# Patient Record
Sex: Male | Born: 1960 | Race: Black or African American | Hispanic: No | Marital: Single | Smoking: Current some day smoker
Health system: Southern US, Community
[De-identification: ages and names within clinical notes are randomized; demographics above are authoritative.]

## PROBLEM LIST (undated history)

## (undated) HISTORY — PX: JOINT REPLACEMENT: SHX530

## (undated) HISTORY — PX: SHOULDER SURGERY: SHX246

---

## 1998-02-21 ENCOUNTER — Emergency Department (HOSPITAL_COMMUNITY): Admission: EM | Admit: 1998-02-21 | Discharge: 1998-02-21 | Payer: Self-pay | Admitting: Emergency Medicine

## 1998-02-21 ENCOUNTER — Encounter: Payer: Self-pay | Admitting: Emergency Medicine

## 2001-03-27 ENCOUNTER — Encounter: Admission: RE | Admit: 2001-03-27 | Discharge: 2001-03-27 | Payer: Self-pay | Admitting: Orthopedic Surgery

## 2001-03-27 ENCOUNTER — Encounter: Payer: Self-pay | Admitting: Orthopedic Surgery

## 2002-05-19 ENCOUNTER — Encounter: Admission: RE | Admit: 2002-05-19 | Discharge: 2002-05-19 | Payer: Self-pay | Admitting: Family Medicine

## 2002-05-19 ENCOUNTER — Encounter: Payer: Self-pay | Admitting: Family Medicine

## 2005-09-17 ENCOUNTER — Encounter: Admission: RE | Admit: 2005-09-17 | Discharge: 2005-09-17 | Payer: Self-pay | Admitting: Family Medicine

## 2006-08-15 ENCOUNTER — Ambulatory Visit (HOSPITAL_COMMUNITY): Admission: RE | Admit: 2006-08-15 | Discharge: 2006-08-15 | Payer: Self-pay | Admitting: Orthopedic Surgery

## 2006-09-03 ENCOUNTER — Ambulatory Visit (HOSPITAL_BASED_OUTPATIENT_CLINIC_OR_DEPARTMENT_OTHER): Admission: RE | Admit: 2006-09-03 | Discharge: 2006-09-04 | Payer: Self-pay | Admitting: Orthopedic Surgery

## 2008-01-09 ENCOUNTER — Ambulatory Visit (HOSPITAL_BASED_OUTPATIENT_CLINIC_OR_DEPARTMENT_OTHER): Admission: RE | Admit: 2008-01-09 | Discharge: 2008-01-09 | Payer: Self-pay | Admitting: Orthopedic Surgery

## 2008-03-23 ENCOUNTER — Inpatient Hospital Stay (HOSPITAL_COMMUNITY): Admission: RE | Admit: 2008-03-23 | Discharge: 2008-03-25 | Payer: Self-pay | Admitting: Orthopedic Surgery

## 2009-02-10 ENCOUNTER — Emergency Department (HOSPITAL_COMMUNITY): Admission: EM | Admit: 2009-02-10 | Discharge: 2009-02-10 | Payer: Self-pay | Admitting: Emergency Medicine

## 2009-02-15 ENCOUNTER — Ambulatory Visit (HOSPITAL_BASED_OUTPATIENT_CLINIC_OR_DEPARTMENT_OTHER): Admission: RE | Admit: 2009-02-15 | Discharge: 2009-02-16 | Payer: Self-pay | Admitting: Orthopedic Surgery

## 2010-08-29 NOTE — Op Note (Signed)
NAME:  Richard Cabrera, Richard Cabrera              ACCOUNT NO.:  [id number]   MEDICAL RECORD NO.:  [id number]          PATIENT TYPE:  AMB   LOCATION:  DSC                          FACILITY:  [facility]   PHYSICIAN:  Richard Fitch. Cabrera, M.D. DATE OF BIRTH:  12-02-1960   DATE OF PROCEDURE:  DATE OF DISCHARGE:                               OPERATIVE REPORT   DATE OF OPERATION:  Sep 03, 2006.   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  Also rule out  significant degenerative arthritis of glenohumeral joint due to prior  anterior instability, treated with a staple anterior capsulorrhaphy more  than 20 years prior.   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   PROCEDURE:  1. Examination of left shoulder under anesthesia, demonstrating mild      posterior instability.  2. Diagnostic arthroscopy of left glenohumeral joint demonstrating a      ruptured long head of the biceps with the proximal stump impinging      within the joint followed by debridement of biceps stump.  3. Debridement of degenerative glenoid labrum and adhesive capsulitis      granulation tissue.  4. Subacromial decompression with acromioplasty and bursectomy with      preservation of coracoacromial ligament.  5. Arthroscopic distal clavicle resection.   OPERATING SURGEON:  Richard Fitch. Cabrera, M.D.   ASSISTANT:  Jonni Sanger, P.A.   ANESTHESIA:  General by endotracheal technique supplemented a left  interscalene block supervised by anesthesiologist, Dr. Gypsy Balsam.   INDICATIONS FOR PROCEDURE:  Richard Cabrera is a 50 year old gentleman  employed by the city of [place].  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  He has a history of prior left shoulder dislocation and during the past  several years, he developed progressive left shoulder pain and  impingement signs.  Recently he had lost motion and had severe night  pain.   Due to the presence of a steel staple from a prior anterior  capsulorrhaphy he was not a candidate for an MRI.  He was sent for a CT  arthrogram which demonstrated what appeared to be a small fixed rotator  cuff tear of the anterior supraspinatus and a ruptured long head of the  biceps. The radiologist could not distinguish a significant amount of  arthritis on the CT arthrogram, however, his plain x-rays revealed  squaring of the humeral head consistent with degenerative changes of the  humeral head.  Due to his chronic pain, he was brought to the operating  room at this time anticipating diagnostic arthroscopy and appropriate  intervention as our findings at surgery dictate.   Preoperatively questions were invited and answered.   PROCEDURE:  Richard Cabrera was brought to the operating room and placed  in supine position on the operating table.  Following the induction of general anesthesia by endotracheal technique,  he was carefully positioned in the beach-chair position with the aid of  torso and head holder for operative shoulder arthroscopy.  The entire  left upper extremity and fore quarter were prepped with DuraPrep and  draped with appropriate arthroscopy drapes.   The shoulder was examined under anesthesia, notable for a moderate  degree of posterior instability with translation of the humeral head  approximately one third the diameter of the head off the glenoid.  He  had moderate interior glenohumeral instability with one third  translation of the head.  We could elevate to a combined position of 160  degrees, externally rotate at 90 degrees abduction to 75 degrees  and  internally rotated approximately 60 degrees.  There was a moderate  degree of adhesive capsulitis although this could be chronic due to his  prior capsulorrhaphy.   The shoulder was then distended with 20 cc of sterile saline and brought  into an anterior spinal needle approach followed by placement of the  scope through a standard posterior portal.  Diagnostic arthroscopy  confirmed grade 4 chondromalacia of more than 50% of the humeral head,  most notable centrally and posteriorly and superiorly.  The deep surface  of the rotator cuff appeared to be intact.  The long head of the biceps  is ruptured with an 18 mm stump hanging within the joint.  The labrum  was extremely degenerative. There were extensive adhesive capsulitis  granulation tissues present.   The glenoid had excellent hyaline cartilage.  The subscapularis appeared  to be intact.  There were no loose bodies noted.   An anterior portal was created under direct vision followed by 4.5  suction shaver to thoroughly debride the glenohumeral joint of  granulation tissue, labral fragments in the biceps tendon.  The biceps  was partially amputated with a basket forceps followed by use of a  grabber to withdrawn through the anterior portal.  After hemostasis was  achieved in the glenohumeral joint, the digital camera was used to  photographically document the grade 4 chondromalacia followed by removal  of the arthroscope.  The scope was then placed in the subacromial space.  The subacromial space was obliterated by an adhesive bursitis.  The  scope ws placed through the lateral portal and with meticulous  technique, the supraspinatus and infraspinatus tendons were tenolysed  off the acromion by release of the adherent and fibrotic bursa.  There  was a type 2 acromion with a large medial osteophyte and a degenerative AC joint. The capsule of the [facility] joint was taken down with a cutting  cautery followed by removal of distal 18  mm of clavicle with a suction  burr and leveling the acromion to a type 1 morphology. The  coracoacromial ligament was preserved.  After extensive bursectomy, the  bursa on the deep surface of the acromion, undersurface of the deltoid  and the rotator cuff tendons was debrided, relieving an adhesive  bursitis predicament.   No effort was made to repair the rotator cuff.  Given the predicament of  severe degeneration of the humeral head and an intact glenoid, Richard Cabrera is an excellent candidate to consider hemiarthroplasty when his  pain becomes problematic.  No effort was undertaken at this time to  remove the staple.  When Mr. Richard Cabrera decides he would like to proceed  with a hemiarthroplasty, we will perform an anterior deltopectoral  exposure of the shoulder and remove the  staple.  The subscapularis will  be taken down and the hemiarthroplasty accomplished through a standard  approach.  At that time, we can address his capsular instability as well  as remove the staple.   FINAL DIAGNOSIS:  Severe glenohumeral degenerative arthritis complicated  by impingement, adhesive capsulitis, labral tear and biceps rupture.   PLAN:  We will have a lengthy postoperative consultation with Mr. Richard Cabrera  regarding future choices for management of his shoulder pain.   COMPLICATIONS:  There were no apparent complications.      Richard Cabrera, M.D.  Electronically Signed     RVS/MEDQ  D:  09/03/2006  T:  09/03/2006  Job:  [id number]   cc:   Luanna Cole. Lenord Fellers, M.D.  Bryan Lemma. Manus Gunning, M.D.

## 2010-08-29 NOTE — Op Note (Signed)
NAME:  Richard Cabrera, Harveer              ACCOUNT NO.:  [id number]   MEDICAL RECORD NO.:  [id number]          PATIENT TYPE:  AMB   LOCATION:  DSC                          FACILITY:  [facility]   PHYSICIAN:  Katy Fitch. Sypher, M.D. 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   POSTOPERATIVE DIAGNOSIS:  Chronic left ulnar neuropathy superimposed on  chronic left upper extremity pain syndrome.   OPERATION:  Anterior subcutaneous transposition of left ulnar nerve at  cubital tunnel with reconstruction of a fascial sleeve to maintain an  anterior position of the ulnar nerve, status post decompression with  both plain film and direct inspection evidence of medial joint line  osteophytes impinging at cubital tunnel, also chronic instability of  ulnar nerve with elbow flexion beyond 90 degrees with anterior roll over  medial epicondyle.   OPERATING SURGEON:  Katy Fitch. Sypher, MD   ASSISTANT:  Marveen Reeks Dasnoit, PA-C   ANESTHESIA:  General by LMA.   SUPERVISING ANESTHESIOLOGIST:  Bedelia Person, MD   INDICATIONS:  Richard Cabrera is a 50 year old former [employer] employee who I have had a working  relationship for more than 6 years.   We have managed bilateral shoulder impingement and significant  degenerative arthritis of the left shoulder.   [name] has worked very hard throughout his career and also had a prior  career as a Community education officer.  He had extensive degenerative disk disease of  the cervical spine, is status post an instability procedure performed  remotely in the left shoulder and has chronic degenerative arthritis of  the left shoulder as well as chronic impingement.  He has had bilateral  shoulder procedures.  He has been complaining of left arm numbness for  months and has been evaluated by Dr. Venetia Maxon of Neurosurgery and Dr.  Clarisse Gouge, neurologist.   Clinically, [name] was noted have an unstable left ulnar nerve.  He was  sent for objective third party electrodiagnostic studies by Dr. Clarisse Gouge.  Dr. Clarisse Gouge noted a conduction velocity of the left ulnar nerve across the  elbow, 43 meters per second with a baseline of greater than 55 meters  per second in the median nerve and other adjacent nerves.  Sincere's had  clinical symptoms of the elbow and numbness in the ulnar aspect of the  hand.   Due to a chronic pain predicament thought to be exacerbated by his  unstable ulnar nerve and given his body habitus as a very thin, very  muscular man and given the fact that his plain films of the elbow  demonstrate medial joint line osteophytes deep to the cubital tunnel.  We recommended proceeding with an anterior subcutaneous transposition.   Preoperatively, he was advised the potential risks and benefits of the  surgery.   He is quite thin and does not have protection for the nerve in a  transposed position per se; however, given his active  lifestyle, working  on a farm, riding horses, and continuing with heavy activities of daily  living, I was reluctant to proceed with anterior submuscular  transposition due to the prolonged rehab the skin and tail.   Mr. Richard Cabrera has been thoroughly evaluated by Dr. Clarisse Gouge and was noted to  have a normal EMG, but significant conduction impairment.  He is brought  to the operating room at this time anticipating anterior subcutaneous  transposition of the left ulnar nerve.   PROCEDURE:  Richard Cabrera is brought to the operating room and  placed in supine position upon the operating table.   Following an anesthesia consult with Dr. Gypsy Balsam, general anesthesia by  LMA technique was recommended and accepted by Mr. Richard Cabrera.   He was brought to room one of the [facility] placed in supine  position upon the  operating table and under Dr. Burnett Corrente direct  supervision, general anesthesia by LMA technique induced.   The left arm was prepped with Betadine soap and solution and sterilely  draped.  One gram of Ancef was administered as IV prophylactic  antibiotic.   Procedure commenced with a 4-cm curvilinear incision paralleling the  path of the ulnar nerve.  The subcutaneous tissues were carefully  divided taking care to identify and protect the posterior branch of the  medial antebrachial cutaneous nerve.   The ulnar nerve was inspected through a range of motion and noted to  have laxity of the arcuate ligament and anterior subluxation of the  epicondyle with flexion beyond 90 degrees.   The nerve was decompressed for a distance of 6 cm above the epicondyle  and approximately 7 cm distally.  A small portion of the flexor carpi  ulnaris muscle belly was removed to facilitate a smooth transition of  the nerve in elbow extension.  The nerve was mobilized with epineural  vessels and great care was taken to protect the articular branch as well  as the branches to the posterior aspect of the flexor carpi ulnaris.  It  was transposed subcutaneously deep to the fascia.  Mr. Richard Cabrera has  minimal subcutaneous fat.   Care was taken to isolate and resect the medial brachial septum during  transposition.  Care was taken to preserve all of the vascular feeding  vessels to the epineurial vessels.   The nerve was transposed and a fascial sleeve created by repairing the  fascia to the medial epicondyle well proximal to maintain an anterior  position of the nerve with elbow extension.   After the nerve was transposed, the profile of the nerve was visible  given his very thin body habitus.  This should, however, be protected  particularly from the medial osteophytes of the cubital tunnel and with  behavior modification should be protected during arm use.   The wound was inspected for bleeding points, which  were  electrocauterized with bipolar forceps under saline followed by release  of the tourniquet.  Hemostasis was achieved by compression and use of  the bipolar cautery.   The wound was repaired with subcutaneous sutures of 4-0 Vicryl and  intradermal 3-0 Prolene with Steri-Strips.  Mr. Richard Cabrera was placed in a  compressive dressing with sterile gauze and Tegaderm followed by an Ace  wrap.   He was advised to begin immediate range of motion excised to the elbow.  He will avoid pressure over the nerve.  We will see him back for  followup in approximate 1 week.   For aftercare, he  is provided a prescription for Percocet 7.5 mg one  p.o. q.4-6 h. p.r.n. pain, 40 tablets without refill.  He is also  provided a Keflex 500 mg one p.o. q.8 h. x4 days as a prophylactic  antibiotic.      Katy Fitch Sypher, M.D.  Electronically Signed     RVS/MEDQ  D:  01/09/2008  T:  01/09/2008  Job:  [id number]

## 2010-08-29 NOTE — Op Note (Signed)
NAME:  Richard Cabrera, Richard Cabrera              ACCOUNT NO.:  [id number]   MEDICAL RECORD NO.:  [id number]          PATIENT TYPE:  INP   LOCATION:  5030                         FACILITY:  [facility]   PHYSICIAN:  Katy Fitch. Sypher, M.D. DATE OF BIRTH:  05-Apr-1961   DATE OF PROCEDURE:  03/23/2008  DATE OF DISCHARGE:                               OPERATIVE REPORT   PREOPERATIVE DIAGNOSES:  1. 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  2. Chronic Bankart lesion, anterior glenohumeral joint with avulsion      of middle glenohumeral ligament, left shoulder.  3. Chronic long head of biceps rupture.   POSTOPERATIVE DIAGNOSES:  1. 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  2. Chronic Bankart lesion, anterior glenohumeral joint with avulsion      of middle glenohumeral ligament, left shoulder.  3. Chronic long head of biceps rupture.  4. Identification of grade 4 chondromalacia of left shoulder humeral      head and minimal anterior glenoid grade 1 and 2 chondromalacia at      site of prior Bankart fracture, also buried steel staple from prior      subscapularis muscle transfer to treat anterior instability in      remote past.   OPERATION:  1. Left shoulder hemiarthroplasty utilizing a 14 x 115-mm Bio-Modular      stem and a 54 x 22-mm Bio-Modular head.  2. Anterior capsular reconstruction with superior advancement and      reconstruction of middle glenohumeral ligament for chronic Bankart      lesion, left shoulder.  3. Long head of biceps tenodesis.  4. Subscapularis reconstruction to lesser tuberosity.  5. Removal of buried steel staple from humeral head.   OPERATING SURGEON:  Katy Fitch. Sypher, MD   ASSISTANT:  Marveen Reeks Dasnoit, PA-C   ANESTHESIA:  General endotracheal  supplemented by a left interscalene  block.   SUPERVISING ANESTHESIOLOGIST:  Guadalupe Maple, MD   ESTIMATED BLOOD LOSS:  300 mL.   DRAINS:  A 7-mm Jackson-Pratt to a suction bulb.   COMPLICATIONS:  None apparent.   INDICATIONS:  Richard Cabrera is a 50 year old right-hand dominant,  employee of the city of [place].  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  He has worked very vigorously during  the past 20 years, shoveling, digging, handling equipment, and Engineer, agricultural.  He has had progressive shoulder pain and is status post  right shoulder debridement and distal clavicle resection and left  shoulder arthroscopy for chronic impingement and instability signs.  At  the time of his left shoulder arthroscopy, he was noted to have severe  humeral head degenerative change due to chronic anterior instability due  to a prior Bankart lesion and Hill-Sachs lesion from a significant  dislocation that was treated elsewhere prior to his employment with a  subscapularis transfer.   Mr. Richard Cabrera was able to work for many years without discomfort but then  had a series of repetitive injuries.  He has been treated for the  injuries but reached a point where he could no longer sleep comfortably  at night, was using narcotic analgesics for pain, and had reached a  point where he was not able to work due to his severe left shoulder  impairment.  Preoperatively, we reviewed with Mr. Richard Cabrera, the  anticipated treatment plan which included a left shoulder  hemiarthroplasty or total shoulder depending on the condition of the  glenoid.  Given the chronic anterior instability, we were anticipating  reconstruction of the anterior glenohumeral ligaments on an as needed  basis based on our findings.  We knew his subscapularis had been  transferred.  We were not sure as to the condition of his capsule  inferiorly.  He had a chronic long head of the biceps rupture due to   impingement that was addressed with his prior arthroscopic procedure.   After informed consent, Mr. Richard Cabrera is brought to the operating at this  time.  Preoperatively, he is advised of potential risks and benefits of  surgery which include possible neurovascular injury, failure to relieve  all of his pain, permanent stiffness, failure to tolerate the implant  components, need for revision at a later date, and possible infection.   Questions were invited and answered in detail.   He is brought to the operating at this time.  Preoperatively, Dr. Noreene Larsson  provided a detailed anesthesia consult.  He recommended proceeding with  a left interscalene block.  This was accepted by Mr. Richard Cabrera.  The block  was placed in the holding area approximately 1 hour prior to the  anticipated surgery.   Mr. Richard Cabrera was given 1 g of Ancef in the holding area and was provided 1  g of vancomycin intraoperatively at the time of component placement.   PROCEDURE:  Richard Cabrera was brought to the operating room and  placed in supine position on the operating table.   Dr. Morley Kos preoperative interscalene block led to excellent anesthesia  of the left shoulder region.   He was transferred to a operative table with an Nurse, children's.  Following induction of general endotracheal anesthesia under Dr.  Morley Kos direct supervision, he was carefully positioned in the beach-  chair position with the aid of a torso and head holder designed for  shoulder arthroscopy.  The entire left upper extremity was prepped with  DuraPrep and draped with impervious arthroscopy drapes.  A Foley  catheter was placed, and calf compression devices were provided for deep  vein thrombosis prophylaxis.   The procedure commenced with resection of the prior surgical scar and  extension to a traditional deltopectoral incision.  Subcutaneous tissues  were carefully divided revealing abundant scar at the deltopectoral  interval  from the prior stabilization surgery.   The cephalic vein was meticulously dissected and feeder vessels from the  deltoid muscle suture ligated with 3-0 silk.  The cephalic vein was  mobilized medially and a large vein complex from the deltoid was suture  ligated.  The bursa space deep to the deltoid was recreated and the  staple identified.  There had been an anterior lateral transfer of the  subscapularis.  There was abundant scarring of the axillary recess.  Once again, extensive dissection was required to identify the inferior  border of the subscapularis, position of the axillary nerve, the  short  head of the biceps, and the coracobrachialis muscles.   The short head of the biceps was relaxed with a cutting cautery to  facilitate exposure of the glenoid.  With great care, the subscapularis  was taken off of the lesser tuberosity and bicipital groove area and  elevated with a stay suture of #2 FiberWire.  After joint entry, the  rotator interval was split, the humerus externally rotated, and the head  delivered.   There was a marginal osteophyte circumferentially around the humeral  head.  The rotator cuff had a very satisfactory insertion from the  bicipital groove anteriorly deep to the supraspinatus, infraspinatus,  and teres minor.  A Fukuda retractor was placed as well as a footed  glenoid retractor anteriorly.  The anterior capsule ligaments were  inspected.  There was an old Bankart fracture at 4 o'clock anteriorly  with a notch into the glenohumeral joint.  The anterior middle  glenohumeral ligament was avulsed and the anterior inferior glenohumeral  ligament was still intact.  The anterior superior glenohumeral ligament  was difficult to define.   The subscapularis was tenolysed from the deep surface of the coracoid  taking care to avoid the brachial plexus.  The subscapularis was  gathered in stay suture and protected.  The neck of the glenoid was  denuded of scar with  a small osteotome, followed by an arthroscopic  rasp.  The insertion of the middle glenohumeral ligament was prepared  for superior advancement and reconstruction.  A grasping suture of #2  FiberWire was placed in such a manner as to evert the ligament to create  a new labrum.  This was brought approximately 1.5 cm proximally and  created an excellent sling for the humeral head.   The shaft of the humerus was delivered and with great care, protecting  the rotator cuff elements, the shaft and neck of the humerus were  prepared with a series of rasps to a 15 mm diameter of the isthmus and  broaching of the proximal humerus to 14 mm.  A trial head measuring 54 x  22 was placed after measuring the native humeral head.  This led to a  very satisfactory reconstruction of the humeral head with an anatomic  height verses the greater tuberosity.  The permanent stem was then  placed with no-touch technique with sutures being placed anteriorly for  repair of the subscapularis with through bone drill holes.   The trial was replaced on the permanent stem and found to be acceptable.   The anterior middle glenohumeral ligament reattachment was then  completed with a PushLock anchor recreating a very satisfactory inset of  the labrum and middle glenohumeral ligament.  With great care, the  shoulder was then delivered with the proximal portion of the humerus  anteriorly and with external rotation of the arm and use of a bone hook,  the neck of the humerus was delivered so as to allow placement of the  humeral head component.   The humeral head was placed with no-touch technique and tamped into  place.   The wound was thoroughly lavaged with sterile saline followed by  reduction of the joint.   The rotator interval was then repaired meticulously with mattress suture  of #2 FiberWire followed by advancement and repair of the subscapularis  anatomically.   The tail of the subscapularis was used to  recreate an anchor point for  the long head of the biceps.  Long head was identified deep  to the  pectoralis major.  This was tenolysed and delivered proximally.  This  was elevated approximately 3 cm and tenodesed to the scar at the site of  the previous staple placement.  The staple was removed with a skid and  needle driver.   The subscapularis was repaired anatomically to the lesser tuberosity.  The shoulder range of motion was examined with elevation 160 degrees,  external rotation of 40, and internal rotation of at least 60 degrees.  There appeared to be excellent superior inferior and anterior posterior  stability.   The wound was then repaired with 0 and 2-0 Vicryl in the subcutaneous  space with a 7-mm Jackson-Pratt drain placed at the level of the  capsule, brought out through a stab wound inferolaterally.  The skin was  repaired with intradermal 3-0 Prolene and Steri-Strips.   For aftercare, Mr. Richard Cabrera is placed in a sling.  He will be transferred  from the Post Anesthesia Care to [address] for observation of  his vital signs.      Katy Fitch Sypher, M.D.  Electronically Signed     RVS/MEDQ  D:  03/23/2008  T:  03/24/2008  Job:  [id number]   cc:   Katy Fitch. Sypher, M.D.

## 2010-09-12 ENCOUNTER — Emergency Department (HOSPITAL_COMMUNITY): Admission: EM | Admit: 2010-09-12 | Payer: Self-pay | Source: Home / Self Care

## 2011-01-19 LAB — CBC
HCT: 31.8 % — ABNORMAL LOW (ref 39.0–52.0)
HCT: 41.7 % (ref 39.0–52.0)
Hemoglobin: 10.5 g/dL — ABNORMAL LOW (ref 13.0–17.0)
Hemoglobin: 13.6 g/dL (ref 13.0–17.0)
MCHC: 33.2 g/dL (ref 30.0–36.0)
MCHC: 33.9 g/dL (ref 30.0–36.0)
MCV: 86.2 fL (ref 78.0–100.0)
Platelets: 182 10*3/uL (ref 150–400)
RBC: 3.66 MIL/uL — ABNORMAL LOW (ref 4.22–5.81)
RBC: 4.83 MIL/uL (ref 4.22–5.81)
WBC: 10.1 10*3/uL (ref 4.0–10.5)
WBC: 11 10*3/uL — ABNORMAL HIGH (ref 4.0–10.5)
WBC: 9.9 10*3/uL (ref 4.0–10.5)

## 2011-01-19 LAB — BASIC METABOLIC PANEL
BUN: 4 mg/dL — ABNORMAL LOW (ref 6–23)
Calcium: 7.9 mg/dL — ABNORMAL LOW (ref 8.4–10.5)
Chloride: 104 mEq/L (ref 96–112)
Creatinine, Ser: 0.82 mg/dL (ref 0.4–1.5)
GFR calc non Af Amer: >60 mL/min (ref >60)
Sodium: 135 mEq/L (ref 135–145)

## 2011-01-19 LAB — GLUCOSE, CAPILLARY: Glucose-Capillary: 81 mg/dL (ref 70–99)

## 2013-05-14 ENCOUNTER — Ambulatory Visit (INDEPENDENT_AMBULATORY_CARE_PROVIDER_SITE_OTHER): Payer: Medicare Other | Admitting: Emergency Medicine

## 2013-05-14 ENCOUNTER — Ambulatory Visit: Payer: Medicare Other

## 2013-05-14 VITALS — BP 110/72 | HR 63 | Temp 97.8°F | Resp 16 | Ht 69.0 in | Wt 157.0 lb

## 2013-05-14 DIAGNOSIS — R0781 Pleurodynia: Secondary | ICD-10-CM

## 2013-05-14 DIAGNOSIS — R079 Chest pain, unspecified: Secondary | ICD-10-CM

## 2013-05-14 MED ORDER — HYDROCODONE-ACETAMINOPHEN 5-325 MG PO TABS: 1.0000 | ORAL_TABLET | Freq: Four times a day (QID) | ORAL | Status: DC | PRN

## 2013-05-14 NOTE — Progress Notes (Addendum)
   Subjective:    Patient ID: Richard Cabrera, male    DOB: 06-Oct-1960, 53 y.o.   MRN: [id number] This chart was scribed for Collene GobbleSteven A Hisayo Delossantos, MD by Valera CastleSteven Perry, ED Scribe. This patient was seen in room 02 and the patient's care was started at 12:42 PM.  Chief Complaint  Patient presents with  . Chest Pain    Rib pain, starts in middle travels to right side to back, X 2 weeks, Fall    HPI Richard Cabrera is a 53 y.o. male He presents with constant, central chest pain, that radiates to his right chest and to his right back, onset 2 weeks ago after falling on the edge of his pickup truck. He denies being seen prior, having imaging done. He denies knowing if he has broken any ribs. He reports trouble sleeping due to the pain. He reports that getting out of bed exacerbates his pain. He denies SOB, and any other associated symptoms. He denies h/o smoking.   PCP - No primary provider on file.  There are no active problems to display for this patient.  History reviewed. No pertinent past medical history. Past Surgical History  Procedure Laterality Date  . Joint replacement     No Known Allergies Prior to Admission medications   Not on File    Review of Systems  Respiratory: Negative for shortness of breath.   Cardiovascular: Positive for chest pain (central, radiates to right chest, right back).      Objective:   Physical Exam Nursing note and vitals reviewed. Constitutional: Pt is oriented to person, place, and time. Pt appears well-developed and well-nourished. No distress.  HENT:  Head: Normocephalic and atraumatic.  Eyes: EOM are normal.  Neck: Neck supple.  Cardiovascular: Normal rate, regular rhythm and normal heart sounds.  Exam reveals no gallop and no friction rub. No murmur heard. Pulmonary/Chest: Effort normal and breath sounds normal. No respiratory distress. Pt has no wheezes. Pt has no rales. Tenderness to palpation right lateral chest wall over 6th,7th,8th ribs.    Abdominal: Soft. Bowel sounds are normal. There is no tenderness. There is no rebound and no guarding.  Musculoskeletal: Normal range of motion.  Neurological: Pt is alert and oriented to person, place, and time.  Skin: Skin is warm and dry.  Psychiatric: Pt has a normal mood and affect. Pt's behavior is normal.   BP 110/72  Pulse 63  Temp(Src) 97.8 F (36.6 C) (Oral)  Resp 16  Ht 5\' 9"  (1.753 m)  Wt 157 lb (71.215 kg)  BMI 23.17 kg/m2  SpO2 98%  [facility] reading (PRIMARY) by Dr. Cleta Albertsaub question irregularity of the right 10th rib please comment no pneumothorax is seen.     Assessment & Plan:   The radiologist did not feel there was a fracture we'll give the patient medication for pain. He will recheck if he continues to have problems. No pneumothorax or fractures were seen by the radiologist the    I personally performed the services described in this documentation, which was scribed in my presence. The recorded information has been reviewed and is accurate.

## 2013-10-24 ENCOUNTER — Emergency Department (INDEPENDENT_AMBULATORY_CARE_PROVIDER_SITE_OTHER)
Admission: EM | Admit: 2013-10-24 | Discharge: 2013-10-24 | Disposition: A | Discharge status: Home / Self Care | Payer: Medicare Other | Source: Home / Self Care | Attending: Emergency Medicine | Admitting: Emergency Medicine

## 2013-10-24 ENCOUNTER — Encounter (HOSPITAL_COMMUNITY): Payer: Self-pay | Admitting: Emergency Medicine

## 2013-10-24 DIAGNOSIS — K029 Dental caries, unspecified: Secondary | ICD-10-CM

## 2013-10-24 MED ORDER — DICLOFENAC POTASSIUM 50 MG PO TABS: 50.0000 mg | ORAL_TABLET | Freq: Two times a day (BID) | ORAL | Status: DC

## 2013-10-24 MED ORDER — LIDOCAINE VISCOUS 2 % MT SOLN: OROMUCOSAL | Status: DC

## 2013-10-24 NOTE — ED Notes (Signed)
Pt    Reports    Symptoms  Of  Toothache l  Upper  Side     Symptoms  X  sev  Weeks

## 2013-10-24 NOTE — ED Provider Notes (Signed)
CSN: [id number]     Arrival date & time 10/24/13  1400 History   First MD Initiated Contact with Patient 10/24/13 1432     Chief Complaint  Patient presents with  . Dental Pain   (Consider location/radiation/quality/duration/timing/severity/associated sxs/prior Treatment) HPI Comments: Patient presents with several weeks of dental pain (left upper #12, 13 & 14) and requests that these teeth be removed.   Patient is a 53 y.o. male presenting with tooth pain. The history is provided by the patient.  Dental Pain   History reviewed. No pertinent past medical history. Past Surgical History  Procedure Laterality Date  . Joint replacement    . Shoulder surgery     History reviewed. No pertinent family history. History  Substance Use Topics  . Smoking status: Never Smoker   . Smokeless tobacco: Not on file  . Alcohol Use: No    Review of Systems  All other systems reviewed and are negative.   Allergies  Review of patient's allergies indicates no known allergies.  Home Medications   Prior to Admission medications   Medication Sig Start Date End Date Taking? Authorizing Provider  diclofenac (CATAFLAM) 50 MG tablet Take 1 tablet (50 mg total) by mouth 2 (two) times daily. As needed for pain 10/24/13   Ardis RowanJennifer Lee Webster Patrone, PA  HYDROcodone-acetaminophen [facility]) 5-325 MG per tablet Take 1 tablet by mouth every 6 (six) hours as needed. 05/14/13   Collene GobbleSteven A Daub, MD  lidocaine (XYLOCAINE) 2 % solution Apply to affected areas using cotton swab q3hrs prn pain 10/24/13   Jess BartersJennifer Lee Marche Hottenstein, PA   BP 110/64  Pulse 60  Temp(Src) 97.3 F (36.3 C) (Oral)  Resp 16  SpO2 96% Physical Exam  Nursing note and vitals reviewed. Constitutional: He is oriented to person, place, and time. He appears well-developed and well-nourished. No distress.  HENT:  Head: Normocephalic and atraumatic.  Mouth/Throat: Uvula is midline, oropharynx is clear and moist and mucous membranes are normal. No oral  lesions. No trismus in the jaw. Dental caries present. No dental abscesses or uvula swelling.  No gumline swelling or erythema. No facial swelling. Multiple missing teeth along with overall poor remaining dentition. Reports discomfort at teeth # 12, 13 & 14.   Eyes: Conjunctivae are normal. No scleral icterus.  Neck: Normal range of motion. Neck supple.  Cardiovascular: Normal rate.   Pulmonary/Chest: Effort normal.  Musculoskeletal: Normal range of motion.  Lymphadenopathy:    He has no cervical adenopathy.  Neurological: He is alert and oriented to person, place, and time.  Skin: Skin is warm and dry.  Psychiatric: He has a normal mood and affect. His behavior is normal.    ED Course  Procedures (including critical care time) Labs Review Labs Reviewed - No data to display  Imaging Review No results found.   MDM   1. Dental cavities    Advised patient that [facility]. Provided patient with printed information regarding upcoming free [facility] 8/28-8/29-2015 along with information for 8 local options for low cost dental care for follow up.  No clinical indication of dental abscess.     Jess BartersJennifer Lee MontfortPresson, GeorgiaPA 10/24/13 1453

## 2013-10-24 NOTE — Discharge Instructions (Signed)
Please use the resource information provided to you to locate dentist for follow up. Medication as prescribed for pain.  Dental Pain A tooth ache may be caused by cavities (tooth decay). Cavities expose the nerve of the tooth to air and hot or cold temperatures. It may come from an infection or abscess (also called a boil or furuncle) around your tooth. It is also often caused by dental caries (tooth decay). This causes the pain you are having. DIAGNOSIS  Your caregiver can diagnose this problem by exam. TREATMENT   If caused by an infection, it may be treated with medications which kill germs (antibiotics) and pain medications as prescribed by your caregiver. Take medications as directed.  Only take over-the-counter or prescription medicines for pain, discomfort, or fever as directed by your caregiver.  Whether the tooth ache today is caused by infection or dental disease, you should see your dentist as soon as possible for further care. SEEK MEDICAL CARE IF: The exam and treatment you received today has been provided on an emergency basis only. This is not a substitute for complete medical or dental care. If your problem worsens or new problems (symptoms) appear, and you are unable to meet with your dentist, call or return to this location. SEEK IMMEDIATE MEDICAL CARE IF:   You have a fever.  You develop redness and swelling of your face, jaw, or neck.  You are unable to open your mouth.  You have severe pain uncontrolled by pain medicine. MAKE SURE YOU:   Understand these instructions.  Will watch your condition.  Will get help right away if you are not doing well or get worse. Document Released: 04/02/2005 Document Revised: 06/25/2011 Document Reviewed: 11/19/2007 [facility] Patient Information 2015 [place]. This information is not intended to replace advice given to you by your health care provider. Make sure you discuss any questions you have with your health care  provider.  Dental Caries  Dental caries (also called tooth decay) is the most common oral disease. It can occur at any age, but is more common in children and young adults.  HOW DENTAL CARIES DEVELOPS  The process of decay begins when bacteria and foods (particularly sugars and starches) combine in your mouth to produce plaque. Plaque is a substance that sticks to the hard, outer surface of a tooth (enamel). The bacteria in plaque produce acids that attack enamel. These acids may also attack the root surface of a tooth (cementum) if it is exposed. Repeated attacks dissolve these surfaces and create holes in the tooth (cavities). If left untreated, the acids destroy the other layers of the tooth.  RISK FACTORS  Frequent sipping of sugary beverages.   Frequent snacking on sugary and starchy foods, especially those that easily get stuck in the teeth.   Poor oral hygiene.   Dry mouth.   Substance abuse such as methamphetamine abuse.   Broken or poor-fitting dental restorations.   Eating disorders.   Gastroesophageal reflux disease (GERD).   Certain radiation treatments to the head and neck. SYMPTOMS In the early stages of dental caries, symptoms are seldom present. Sometimes white, chalky areas may be seen on the enamel or other tooth layers. In later stages, symptoms may include:  Pits and holes on the enamel.  Toothache after sweet, hot, or cold foods or drinks are consumed.  Pain around the tooth.  Swelling around the tooth. DIAGNOSIS  Most of the time, dental caries is detected during a regular dental checkup. A diagnosis is  made after a thorough medical and dental history is taken and the surfaces of your teeth are checked for signs of dental caries. Sometimes special instruments, such as lasers, are used to check for dental caries. Dental X-ray exams may be taken so that areas not visible to the eye (such as between the contact areas of the teeth) can be checked for  cavities.  TREATMENT  If dental caries is in its early stages, it may be reversed with a fluoride treatment or an application of a remineralizing agent at the dental office. Thorough brushing and flossing at home is needed to aid these treatments. If it is in its later stages, treatment depends on the location and extent of tooth destruction:   If a small area of the tooth has been destroyed, the destroyed area will be removed and cavities will be filled with a material such as gold, silver amalgam, or composite resin.   If a large area of the tooth has been destroyed, the destroyed area will be removed and a cap (crown) will be fitted over the remaining tooth structure.   If the center part of the tooth (pulp) is affected, a procedure called a root canal will be needed before a filling or crown can be placed.   If most of the tooth has been destroyed, the tooth may need to be pulled (extracted). HOME CARE INSTRUCTIONS You can prevent, stop, or reverse dental caries at home by practicing good oral hygiene. Good oral hygiene includes:  Thoroughly cleaning your teeth at least twice a day with a toothbrush and dental floss.   Using a fluoride toothpaste. A fluoride mouth rinse may also be used if recommended by your dentist or health care provider.   Restricting the amount of sugary and starchy foods and sugary liquids you consume.   Avoiding frequent snacking on these foods and sipping of these liquids.   Keeping regular visits with a dentist for checkups and cleanings. PREVENTION   Practice good oral hygiene.  Consider a dental sealant. A dental sealant is a coating material that is applied by your dentist to the pits and grooves of teeth. The sealant prevents food from being trapped in them. It may protect the teeth for several years.  Ask about fluoride supplements if you live in a community without fluorinated water or with water that has a low fluoride content. Use fluoride  supplements as directed by your dentist or health care provider.  Allow fluoride varnish applications to teeth if directed by your dentist or health care provider. Document Released: 12/23/2001 Document Revised: 12/03/2012 Document Reviewed: 04/04/2012 [facility] Patient Information 2015 [place], [place]. This information is not intended to replace advice given to you by your health care provider. Make sure you discuss any questions you have with your health care provider.

## 2013-10-26 ENCOUNTER — Ambulatory Visit (INDEPENDENT_AMBULATORY_CARE_PROVIDER_SITE_OTHER): Payer: Medicare Other | Admitting: Emergency Medicine

## 2013-10-26 VITALS — BP 130/78 | HR 49 | Temp 97.8°F | Resp 14 | Ht 68.5 in | Wt 159.4 lb

## 2013-10-26 DIAGNOSIS — K0889 Other specified disorders of teeth and supporting structures: Secondary | ICD-10-CM

## 2013-10-26 DIAGNOSIS — K089 Disorder of teeth and supporting structures, unspecified: Secondary | ICD-10-CM

## 2013-10-26 MED ORDER — PENICILLIN V POTASSIUM 500 MG PO TABS: 500.0000 mg | ORAL_TABLET | Freq: Four times a day (QID) | ORAL | Status: DC

## 2013-10-26 MED ORDER — ACETAMINOPHEN-CODEINE #3 300-30 MG PO TABS: 1.0000 | ORAL_TABLET | ORAL | Status: DC | PRN

## 2013-10-26 NOTE — Patient Instructions (Signed)
Dental Caries  Dental caries (also called tooth decay) is the most common oral disease. It can occur at any age, but is more common in children and young adults.  HOW DENTAL CARIES DEVELOPS  The process of decay begins when bacteria and foods (particularly sugars and starches) combine in your mouth to produce plaque. Plaque is a substance that sticks to the hard, outer surface of a tooth (enamel). The bacteria in plaque produce acids that attack enamel. These acids may also attack the root surface of a tooth (cementum) if it is exposed. Repeated attacks dissolve these surfaces and create holes in the tooth (cavities). If left untreated, the acids destroy the other layers of the tooth.  RISK FACTORS  Frequent sipping of sugary beverages.   Frequent snacking on sugary and starchy foods, especially those that easily get stuck in the teeth.   Poor oral hygiene.   Dry mouth.   Substance abuse such as methamphetamine abuse.   Broken or poor-fitting dental restorations.   Eating disorders.   Gastroesophageal reflux disease (GERD).   Certain radiation treatments to the head and neck. SYMPTOMS In the early stages of dental caries, symptoms are seldom present. Sometimes white, chalky areas may be seen on the enamel or other tooth layers. In later stages, symptoms may include:  Pits and holes on the enamel.  Toothache after sweet, hot, or cold foods or drinks are consumed.  Pain around the tooth.  Swelling around the tooth. DIAGNOSIS  Most of the time, dental caries is detected during a regular dental checkup. A diagnosis is made after a thorough medical and dental history is taken and the surfaces of your teeth are checked for signs of dental caries. Sometimes special instruments, such as lasers, are used to check for dental caries. Dental X-ray exams may be taken so that areas not visible to the eye (such as between the contact areas of the teeth) can be checked for cavities.   TREATMENT  If dental caries is in its early stages, it may be reversed with a fluoride treatment or an application of a remineralizing agent at the dental office. Thorough brushing and flossing at home is needed to aid these treatments. If it is in its later stages, treatment depends on the location and extent of tooth destruction:   If a small area of the tooth has been destroyed, the destroyed area will be removed and cavities will be filled with a material such as gold, silver amalgam, or composite resin.   If a large area of the tooth has been destroyed, the destroyed area will be removed and a cap (crown) will be fitted over the remaining tooth structure.   If the center part of the tooth (pulp) is affected, a procedure called a root canal will be needed before a filling or crown can be placed.   If most of the tooth has been destroyed, the tooth may need to be pulled (extracted). HOME CARE INSTRUCTIONS You can prevent, stop, or reverse dental caries at home by practicing good oral hygiene. Good oral hygiene includes:  Thoroughly cleaning your teeth at least twice a day with a toothbrush and dental floss.   Using a fluoride toothpaste. A fluoride mouth rinse may also be used if recommended by your dentist or health care provider.   Restricting the amount of sugary and starchy foods and sugary liquids you consume.   Avoiding frequent snacking on these foods and sipping of these liquids.   Keeping regular visits with   a dentist for checkups and cleanings. PREVENTION   Practice good oral hygiene.  Consider a dental sealant. A dental sealant is a coating material that is applied by your dentist to the pits and grooves of teeth. The sealant prevents food from being trapped in them. It may protect the teeth for several years.  Ask about fluoride supplements if you live in a community without fluorinated water or with water that has a low fluoride content. Use fluoride supplements  as directed by your dentist or health care provider.  Allow fluoride varnish applications to teeth if directed by your dentist or health care provider. Document Released: 12/23/2001 Document Revised: 12/03/2012 Document Reviewed: 04/04/2012 ExitCare Patient Information 2015 [employer]. This information is not intended to replace advice given to you by your health care provider. Make sure you discuss any questions you have with your health care provider.  

## 2013-10-26 NOTE — Progress Notes (Signed)
[facility] [address], [place] [address] [phone]  Date:  10/26/2013   Name:  Richard Cabrera   DOB:  12-22-1960   MRN:  [id number]  PCP:  No PCP Per Patient    Chief Complaint: Dental Pain   History of Present Illness:  Keavon A Cabrera is a 53 y.o. very pleasant male patient who presents with the following:  Has 1 month duration pain that is increasing in left maxillary molar.  No fever or chills.  No facial swelling.  Needs a referral to dentist  No improvement with over the counter medications or other home remedies. Denies other complaint or health concern today.   There are no active problems to display for this patient.   History reviewed. No pertinent past medical history.  Past Surgical History  Procedure Laterality Date  . Joint replacement    . Shoulder surgery      History  Substance Use Topics  . Smoking status: Never Smoker   . Smokeless tobacco: Not on file  . Alcohol Use: No    History reviewed. No pertinent family history.  No Known Allergies  Medication list has been reviewed and updated.  Current Outpatient Prescriptions on File Prior to Visit  Medication Sig Dispense Refill  . diclofenac (CATAFLAM) 50 MG tablet Take 1 tablet (50 mg total) by mouth 2 (two) times daily. As needed for pain  30 tablet  0  . HYDROcodone-acetaminophen (NORCO) 5-325 MG per tablet Take 1 tablet by mouth every 6 (six) hours as needed.  30 tablet  0  . lidocaine (XYLOCAINE) 2 % solution Apply to affected areas using cotton swab q3hrs prn pain  100 mL  0   No current facility-administered medications on file prior to visit.    Review of Systems:  As per HPI, otherwise negative.    Physical Examination: Filed Vitals:   10/26/13 1817  BP: 130/78  Pulse: 49  Temp: 97.8 F (36.6 C)  Resp: 14   Filed Vitals:   10/26/13 1817  Height: 5' 8.5" (1.74 m)  Weight: 159 lb 6.4 oz (72.303 kg)   Body mass index is 23.88 kg/(m^2). Ideal  Body Weight: Weight in (lb) to have BMI = 25: 166.5   GEN: WDWN, NAD, Non-toxic, Alert & Oriented x 3 HEENT: Atraumatic, Normocephalic.  Ears and Nose: No external deformity. EXTR: No clubbing/cyanosis/edema NEURO: Normal gait.  PSYCH: Normally interactive. Conversant. Not depressed or anxious appearing.  Calm demeanor.  Poor dentition.  #14 painful and tender  Assessment and Plan: Dental pain tyl #3 penvk Dentist  Signed,  Phillips OdorJeffery Anderson, MD

## 2013-10-26 NOTE — ED Provider Notes (Signed)
Medical screening examination/treatment/procedure(s) were performed by non-physician practitioner and as supervising physician I was immediately available for consultation/collaboration.  Dametrius Sanjuan, M.D.  Makylah Bossard C Miner Koral, MD 10/26/13 0814 

## 2014-12-03 ENCOUNTER — Telehealth: Payer: Self-pay | Admitting: Internal Medicine

## 2014-12-03 ENCOUNTER — Ambulatory Visit (INDEPENDENT_AMBULATORY_CARE_PROVIDER_SITE_OTHER): Payer: Medicare Other

## 2014-12-03 ENCOUNTER — Ambulatory Visit (INDEPENDENT_AMBULATORY_CARE_PROVIDER_SITE_OTHER): Payer: Medicare Other | Admitting: Internal Medicine

## 2014-12-03 VITALS — BP 126/70 | HR 61 | Temp 98.2°F | Resp 18 | Ht 70.5 in | Wt 152.2 lb

## 2014-12-03 DIAGNOSIS — M25552 Pain in left hip: Secondary | ICD-10-CM | POA: Diagnosis not present

## 2014-12-03 DIAGNOSIS — R937 Abnormal findings on diagnostic imaging of other parts of musculoskeletal system: Secondary | ICD-10-CM

## 2014-12-03 MED ORDER — MELOXICAM 15 MG PO TABS: 15.0000 mg | ORAL_TABLET | Freq: Every day | ORAL | Status: DC

## 2014-12-03 NOTE — Progress Notes (Addendum)
Subjective:  This chart was scribed for Richard Sia, MD by [facility], medical scribe at [facility].The patient was seen in exam room 04 and the patient's care was started at 10:34 AM.   Patient ID: Richard Cabrera, male    DOB: 02-May-1960, 54 y.o.   MRN: [id number] Chief Complaint  Patient presents with  . Hip Pain    started 1 mo ago. left side    HPI HPI Comments: Richard Cabrera is a 54 y.o. male who [facility] complaining of a constant, sharp left sided hip pain, ongoing for three months after falling off his horse. Difficulty with ambulating, getting out of a car, and walking up stairs.  History reviewed. No pertinent past medical history. Prior to Admission medications   Medication Sig Start Date End Date Taking? Authorizing Provider  acetaminophen-codeine (TYLENOL #3) 300-30 MG per tablet Take 1-2 tablets by mouth every 4 (four) hours as needed. 10/26/13  Yes Carmelina Dane, MD  HYDROcodone-acetaminophen (NORCO) 5-325 MG per tablet Take 1 tablet by mouth every 6 (six) hours as needed. 05/14/13  Yes Collene Gobble, MD  diclofenac (CATAFLAM) 50 MG tablet Take 1 tablet (50 mg total) by mouth 2 (two) times daily. As needed for pain Patient not taking: Reported on 12/03/2014 10/24/13   Ria Clock, PA  lidocaine (XYLOCAINE) 2 % solution Apply to affected areas using cotton swab q3hrs prn pain Patient not taking: Reported on 12/03/2014 10/24/13   Ria Clock, PA  penicillin v potassium (VEETID) 500 MG tablet Take 1 tablet (500 mg total) by mouth 4 (four) times daily. 10/26/13   Carmelina Dane, MD   No Known Allergies  Review of Systems  Musculoskeletal: Positive for arthralgias and gait problem.      Objective:  BP 126/70 mmHg  Pulse 61  Temp(Src) 98.2 F (36.8 C) (Oral)  Resp 18  Ht 5' 10.5" (1.791 m)  Wt 152 lb 3.2 oz (69.037 kg)  BMI 21.52 kg/m2  SpO2 97% Physical Exam  Constitutional:  He is oriented to person, place, and time. He appears well-developed and well-nourished. No distress.  HENT:  Head: Normocephalic and atraumatic.  Eyes: Pupils are equal, round, and reactive to light.  Neck: Normal range of motion.  Cardiovascular: Normal rate and regular rhythm.   Pulmonary/Chest: Effort normal. No respiratory distress.  Musculoskeletal: Normal range of motion.  Neurological: He is alert and oriented to person, place, and time.  Skin: Skin is warm and dry.  Psychiatric: He has a normal mood and affect. His behavior is normal.  Nursing note and vitals reviewed.  [facility] reading (PRIMARY) by  Dr. Taryn Nave=narrowing joint space but no fx or necrosis ? More dense L pub ramus.       Assessment & Plan:  Pain in left hip - Plan: DG HIP UNILAT W OR W/O PELVIS 2-3 VIEWS LEFT underlying OA L Hip Recent trauma with troch bursitis   melox 15 To [facility] to consider inj/further rx    I have completed the patient encounter in its entirety as documented by the scribe, with editing by me where necessary. Gursimran Litaker P. Merla Riches, M.D.  Addend= Rad interpr IMPRESSION: 1. There is no acute or significant chronic bony abnormality of the left hip. 2. Since the previous study the pubic symphysis has widened. This is of uncertain etiology. Is there a history of trauma? 3. There is increased conspicuity of scleroses  at the junction of the right superior and inferior pubic rami. There is stable irregularity of the medial surface of this junction. There is also increased bony density that projects over the inferior pubic ramus on the left but may be arising from the inferior cortex of the superior pubic ramus. Pelvic CT scanning is recommended.

## 2014-12-03 NOTE — Telephone Encounter (Signed)
Called pt and explained results and recommendation to go ahead and schedule a CT. Pt agreed, but wants [place] to call his sister's number (listed as home #) when it is scheduled. Pt is not aware of any injuries to pubic area. Dr Merla Riches, I didn't know which CT to choose (note on results didn't specify). Also what Dx to use? When you put in order please put in the following # in comments for Referrals to call [phone].

## 2014-12-03 NOTE — Telephone Encounter (Signed)
IMPRESSION: 1. There is no acute or significant chronic bony abnormality of the left hip. 2. Since the previous study the pubic symphysis has widened. This is of uncertain etiology. Is there a history of trauma? 3. There is increased conspicuity of scleroses at the junction of the right superior and inferior pubic rami. There is stable irregularity of the medial surface of this junction. There is also increased bony density that projects over the inferior pubic ramus on the left but may be arising from the inferior cortex of the superior pubic ramus. Pelvic CT scanning is recommended.  With this interpretation, call patient to see if we can go ahead and schedule the CT before his ortho referral goes thru

## 2014-12-04 NOTE — Telephone Encounter (Signed)
Referrals to call [phone].(sister#)with sched

## 2014-12-10 ENCOUNTER — Other Ambulatory Visit: Payer: Self-pay | Admitting: *Deleted

## 2014-12-10 DIAGNOSIS — R937 Abnormal findings on diagnostic imaging of other parts of musculoskeletal system: Secondary | ICD-10-CM

## 2014-12-10 DIAGNOSIS — M25552 Pain in left hip: Secondary | ICD-10-CM

## 2014-12-13 ENCOUNTER — Ambulatory Visit
Admission: RE | Admit: 2014-12-13 | Discharge: 2014-12-13 | Disposition: A | Discharge status: Home / Self Care | Payer: Medicare Other | Source: Ambulatory Visit | Attending: Internal Medicine | Admitting: Internal Medicine

## 2014-12-13 ENCOUNTER — Telehealth: Payer: Self-pay

## 2014-12-13 DIAGNOSIS — M25552 Pain in left hip: Secondary | ICD-10-CM

## 2014-12-13 DIAGNOSIS — R937 Abnormal findings on diagnostic imaging of other parts of musculoskeletal system: Secondary | ICD-10-CM

## 2014-12-13 NOTE — Telephone Encounter (Signed)
[facility] called report for pt.

## 2018-11-04 DIAGNOSIS — Z20828 Contact with and (suspected) exposure to other viral communicable diseases: Secondary | ICD-10-CM | POA: Diagnosis not present

## 2018-11-04 DIAGNOSIS — U071 COVID-19: Secondary | ICD-10-CM | POA: Diagnosis not present

## 2019-01-20 ENCOUNTER — Other Ambulatory Visit: Payer: Self-pay

## 2019-01-20 ENCOUNTER — Ambulatory Visit (HOSPITAL_COMMUNITY)
Admission: EM | Admit: 2019-01-20 | Discharge: 2019-01-20 | Disposition: A | Discharge status: Home / Self Care | Payer: Medicare HMO | Attending: Family Medicine | Admitting: Family Medicine

## 2019-01-20 ENCOUNTER — Encounter (HOSPITAL_COMMUNITY): Payer: Self-pay | Admitting: Emergency Medicine

## 2019-01-20 DIAGNOSIS — N5089 Other specified disorders of the male genital organs: Secondary | ICD-10-CM | POA: Diagnosis not present

## 2019-01-20 DIAGNOSIS — N529 Male erectile dysfunction, unspecified: Secondary | ICD-10-CM

## 2019-01-20 MED ORDER — SILDENAFIL CITRATE 50 MG PO TABS
50.0000 mg | ORAL_TABLET | Freq: Every day | ORAL | 0 refills | Status: AC | PRN
Start: 2019-01-20 — End: ?

## 2019-01-20 MED ORDER — LEVOFLOXACIN 500 MG PO TABS: 500.0000 mg | ORAL_TABLET | Freq: Every day | ORAL | 0 refills | Status: DC

## 2019-01-20 NOTE — ED Provider Notes (Signed)
[facility]   [id number] 01/20/19 Arrival Time: 1656  ASSESSMENT & PLAN:  1. Scrotal swelling   2. Erectile dysfunction, unspecified erectile dysfunction type     No sign of scrotal abscess requiring I&D. Will treat empirically for epididymitis.  Meds ordered this encounter  Medications  . sildenafil (VIAGRA) 50 MG tablet    Sig: Take 1 tablet (50 mg total) by mouth daily as needed for erectile dysfunction (One hour before sexual activity.).    Dispense:  10 tablet    Refill:  0  . levofloxacin (LEVAQUIN) 500 MG tablet    Sig: Take 1 tablet (500 mg total) by mouth daily.    Dispense:  10 tablet    Refill:  0   To assess scrotal swelling and to evaluate reported erectile dysfunction, recommend: Follow-up Information    Schedule an appointment as soon as possible for a visit  with [facility].   Contact information: [address] [place] [address] [phone]         He declines STD testing.  Otherwise may f/u here as needed. Reviewed expectations re: course of current medical issues. Questions answered. Outlined signs and symptoms indicating need for more acute intervention. Patient verbalized understanding. After Visit Summary given.   SUBJECTIVE:  Richard Cabrera is a 58 y.o. male who presents with complaint of R-sided scrotal swelling. No frank pain reported "but it does feel a little sore sometimes". No scrotal skin change or rashes reported. No specific aggravating or alleviating factors reported. No h/o similar. Unsure for how long this has been present "but definitely have noticed it over the past few weeks". No penile discharge. Sexually active with is wife only. No scrotal injury. Denies urinary urgency or frequency. No dysuria. Afebrile. No abdominal or pelvic pain. Normal po intake without n/v. No rashes or lesions.  OTC treatment: none. No new medication.Marland Kitchen History of STI: none reported.  Also reports  trouble with maintaining an erection. Has noted this for several months. Desires trial of Viagra. Used on of his friend's Viagra and felt it helped.  ROS: As per HPI. All other systems negative.   OBJECTIVE:  Vitals:   01/20/19 1718  BP: 107/89  Pulse: 65  Resp: 18  Temp: 97.9 F (36.6 C)  TempSrc: Oral  SpO2: 98%     General appearance: alert, cooperative, appears stated age and no distress Throat: lips, mucosa, and tongue normal; teeth and gums normal CV: RRR Lungs: CTAB Back: no CVA tenderness; FROM at waist Abdomen: soft, non-tender GU: normal appearing genitalia; R scrotum with mild swelling when compared to L; does report tenderness over epididymis; no masses appreciated; no hernias appreciated Skin: warm and dry Neuro: normal gait Psychological: alert and cooperative; normal mood and affect.    Labs Reviewed - No data to display  No Known Allergies  PMH: Dental cavities.  FH: HTN  Social History   Socioeconomic History  . Marital status: Single    Spouse name: Not on file  . Number of children: Not on file  . Years of education: Not on file  . Highest education level: Not on file  Occupational History  . Not on file  Social Needs  . Financial resource strain: Not on file  . Food insecurity    Worry: Not on file    Inability: Not on file  . Transportation needs    Medical: Not on file    Non-medical: Not on file  Tobacco  Use  . Smoking status: Never Smoker  Substance and Sexual Activity  . Alcohol use: No  . Drug use: No  . Sexual activity: Not on file  Lifestyle  . Physical activity    Days per week: Not on file    Minutes per session: Not on file  . Stress: Not on file  Relationships  . Social Herbalist on phone: Not on file    Gets together: Not on file    Attends religious service: Not on file    Active member of club or organization: Not on file    Attends meetings of clubs or organizations: Not on file    Relationship  status: Not on file  . Intimate partner violence    Fear of current or ex partner: Not on file    Emotionally abused: Not on file    Physically abused: Not on file    Forced sexual activity: Not on file  Other Topics Concern  . Not on file  Social History Narrative  . Not on file           Vanessa Kick, MD 01/21/19 [phone]

## 2019-01-20 NOTE — ED Triage Notes (Signed)
Pt here for right testicle swelling x several weeks but worse over last several days

## 2019-01-22 DIAGNOSIS — N5201 Erectile dysfunction due to arterial insufficiency: Secondary | ICD-10-CM | POA: Diagnosis not present

## 2019-01-22 DIAGNOSIS — I861 Scrotal varices: Secondary | ICD-10-CM | POA: Diagnosis not present

## 2019-01-22 DIAGNOSIS — N430 Encysted hydrocele: Secondary | ICD-10-CM | POA: Diagnosis not present

## 2019-03-02 ENCOUNTER — Encounter (HOSPITAL_COMMUNITY): Payer: Self-pay

## 2019-03-02 ENCOUNTER — Ambulatory Visit (HOSPITAL_COMMUNITY)
Admission: EM | Admit: 2019-03-02 | Discharge: 2019-03-02 | Disposition: A | Discharge status: Home / Self Care | Payer: Medicare HMO | Attending: Internal Medicine | Admitting: Internal Medicine

## 2019-03-02 ENCOUNTER — Other Ambulatory Visit: Payer: Self-pay

## 2019-03-02 DIAGNOSIS — N430 Encysted hydrocele: Secondary | ICD-10-CM | POA: Diagnosis not present

## 2019-03-02 DIAGNOSIS — H9191 Unspecified hearing loss, right ear: Secondary | ICD-10-CM

## 2019-03-02 DIAGNOSIS — N5201 Erectile dysfunction due to arterial insufficiency: Secondary | ICD-10-CM | POA: Diagnosis not present

## 2019-03-02 DIAGNOSIS — I861 Scrotal varices: Secondary | ICD-10-CM | POA: Diagnosis not present

## 2019-03-02 MED ORDER — FLUTICASONE PROPIONATE 50 MCG/ACT NA SUSP: 1.0000 | Freq: Every day | NASAL | 0 refills | Status: DC

## 2019-03-02 NOTE — ED Triage Notes (Signed)
Pt present to the UC with hearing problem in his right ear x 2 months aprox. Pt states his right ear feels full x 2 months aprox.

## 2019-03-02 NOTE — Discharge Instructions (Signed)
No wax build up or signs of infection Ear was normal Follow up with ENT for further evaluation of hearing loss May try flonase nasal spray 1-2 spray in each nostril daily

## 2019-03-02 NOTE — ED Provider Notes (Signed)
[facility]    CSN: [id number] Arrival date & time: 03/02/19  1112      History   Chief Complaint Chief Complaint  Patient presents with  . Hearing Problem  . Ear Fullness    HPI Richard Cabrera is a 58 y.o. male no significant past medical history presenting today for decreased hearing.  Patient states that over the past 2 months he has had decreased hearing in his right ear.  States that it feels full.  Denies any associated pain, but will occasionally have a brief shooting discomfort.  Denies any recent URI symptoms, congestion.  Denies fevers.  Denies history of any problems with ears.  He does believe he has been told he has difficulty hearing out of his left ear as well.  HPI  History reviewed. No pertinent past medical history.  There are no active problems to display for this patient.   Past Surgical History:  Procedure Laterality Date  . JOINT REPLACEMENT    . SHOULDER SURGERY         Home Medications    Prior to Admission medications   Medication Sig Start Date End Date Taking? Authorizing Provider  fluticasone (FLONASE) 50 MCG/ACT nasal spray Place 1 spray into both nostrils daily for 10 days. 03/02/19 03/12/19  Wieters, Hallie C, PA-C  levofloxacin (LEVAQUIN) 500 MG tablet Take 1 tablet (500 mg total) by mouth daily. 01/20/19   Vanessa Kick, MD  meloxicam (MOBIC) 15 MG tablet Take 1 tablet (15 mg total) by mouth daily. 12/03/14   Leandrew Koyanagi, MD  sildenafil (VIAGRA) 50 MG tablet Take 1 tablet (50 mg total) by mouth daily as needed for erectile dysfunction (One hour before sexual activity.). 01/20/19   Vanessa Kick, MD    Family History History reviewed. No pertinent family history.  Social History Social History   Tobacco Use  . Smoking status: Never Smoker  . Smokeless tobacco: Never Used  Substance Use Topics  . Alcohol use: No  . Drug use: No     Allergies   Patient has no known allergies.   Review of Systems Review  of Systems  Constitutional: Negative for activity change, appetite change, chills, fatigue and fever.  HENT: Positive for hearing loss. Negative for congestion, ear pain, rhinorrhea, sinus pressure, sore throat and trouble swallowing.   Eyes: Negative for discharge and redness.  Respiratory: Negative for cough, chest tightness and shortness of breath.   Cardiovascular: Negative for chest pain.  Gastrointestinal: Negative for abdominal pain, diarrhea, nausea and vomiting.  Musculoskeletal: Negative for myalgias.  Skin: Negative for rash.  Neurological: Negative for dizziness, light-headedness and headaches.     Physical Exam Triage Vital Signs ED Triage Vitals  Enc Vitals Group     BP 03/02/19 1148 129/69     Pulse Rate 03/02/19 1148 69     Resp 03/02/19 1148 16     Temp 03/02/19 1148 97.7 F (36.5 C)     Temp Source 03/02/19 1148 Oral     SpO2 03/02/19 1148 97 %     Weight --      Height --      Head Circumference --      Peak Flow --      Pain Score 03/02/19 1147 0     Pain Loc --      Pain Edu? --      Excl. in [place]? --    No data found.  Updated Vital Signs BP 129/69 (BP Location:  Right Arm)   Pulse 69   Temp 97.7 F (36.5 C) (Oral)   Resp 16   SpO2 97%   Visual Acuity Right Eye Distance:   Left Eye Distance:   Bilateral Distance:    Right Eye Near:   Left Eye Near:    Bilateral Near:     Physical Exam Vitals signs and nursing note reviewed.  Constitutional:      Appearance: He is well-developed.     Comments: No acute distress  HENT:     Head: Normocephalic and atraumatic.     Ears:     Comments: Bilateral ears without tenderness to palpation of external auricle, tragus and mastoid, EAC's without erythema or swelling, TM's with good bony landmarks and cone of light. Non erythematous.     Nose: Nose normal.     Comments: Nasal mucosa pink, nonswollen turbinates    Mouth/Throat:     Comments: Oral mucosa pink and moist, no tonsillar enlargement or  exudate. Posterior pharynx patent and nonerythematous, no uvula deviation or swelling. Normal phonation.  Eyes:     Conjunctiva/sclera: Conjunctivae normal.  Neck:     Musculoskeletal: Neck supple.  Cardiovascular:     Rate and Rhythm: Normal rate.  Pulmonary:     Effort: Pulmonary effort is normal. No respiratory distress.  Abdominal:     General: There is no distension.  Musculoskeletal: Normal range of motion.  Skin:    General: Skin is warm and dry.  Neurological:     Mental Status: He is alert and oriented to person, place, and time.      UC Treatments / Results  Labs (all labs ordered are listed, but only abnormal results are displayed) Labs Reviewed - No data to display  EKG   Radiology No results found.  Procedures Procedures (including critical care time)  Medications Ordered in UC Medications - No data to display  Initial Impression / Assessment and Plan / UC Course  I have reviewed the triage vital signs and the nursing notes.  Pertinent labs & imaging results that were available during my care of the patient were reviewed by me and considered in my medical decision making (see chart for details).     Exam unremarkable, no signs of infection, no cerumen impaction impeding hearing.  May have underlying sensorineural hearing loss, will have follow-up with ENT for further evaluation of decreased hearing.  Provided contact for [facility], advised if they are having difficulty getting set up to call back and I will send referral.  Discussed strict return precautions. Patient verbalized understanding and is agreeable with plan.  Final Clinical Impressions(s) / UC Diagnoses   Final diagnoses:  Decreased hearing of right ear     Discharge Instructions     No wax build up or signs of infection Ear was normal Follow up with ENT for further evaluation of hearing loss May try flonase nasal spray 1-2 spray in each nostril daily   ED Prescriptions     Medication Sig Dispense Auth. Provider   fluticasone (FLONASE) 50 MCG/ACT nasal spray Place 1 spray into both nostrils daily for 10 days. 1 g Wieters, [place] C, PA-C     PDMP not reviewed this encounter.   Lew Dawes, PA-C 03/02/19 1233

## 2019-11-28 ENCOUNTER — Encounter (HOSPITAL_COMMUNITY): Payer: Self-pay

## 2019-11-28 ENCOUNTER — Other Ambulatory Visit: Payer: Self-pay

## 2019-11-28 ENCOUNTER — Ambulatory Visit (HOSPITAL_COMMUNITY)
Admission: EM | Admit: 2019-11-28 | Discharge: 2019-11-28 | Disposition: A | Discharge status: Home / Self Care | Payer: Medicare HMO | Attending: Physician Assistant | Admitting: Physician Assistant

## 2019-11-28 DIAGNOSIS — M7662 Achilles tendinitis, left leg: Secondary | ICD-10-CM | POA: Diagnosis not present

## 2019-11-28 MED ORDER — MELOXICAM 15 MG PO TABS: 15.0000 mg | ORAL_TABLET | Freq: Every day | ORAL | 0 refills | Status: AC

## 2019-11-28 MED ORDER — HYDROCODONE-ACETAMINOPHEN 5-325 MG PO TABS: 1.0000 | ORAL_TABLET | Freq: Every day | ORAL | 0 refills | Status: DC

## 2019-11-28 NOTE — Discharge Instructions (Addendum)
Wear the boot  Medications as prescribed, and only the medications prescribed -The Mobic daily -Take 1-2 of the Norco/Vicodin at night only.  This may make you sleepy so do not drive, operate machinery or drink alcohol within 8 hours of taking  Call the sports medicine group Monday morning for follow-up next week.  [phone]

## 2019-11-28 NOTE — ED Triage Notes (Signed)
Patient reports he was helping his neighbor who was stuck in a tree on 11/19/19 when something struck him in the back of left calf.

## 2019-11-28 NOTE — ED Provider Notes (Addendum)
[facility]    CSN: [id number] Arrival date & time: 11/28/19  1014      History   Chief Complaint Chief Complaint  Patient presents with  . Leg Injury    HPI Richard Cabrera is a 59 y.o. male.   Patient presents with left calf and Achilles pain.  He reports this is been going on since a tree limb struck the back of his left calf on 11/18/2019.  He reports at the time he was helping a neighbor out of a tree when a tree limb fell and hit the back of his left calf.  He reports some scrapes and a wound.  These been healing well.  He has continued to have pain in the back of the calf down to the heel.  He reports he has to walk on his tiptoe due to the pain at times.  He reports the pain gets a little better if he is walking for a bit and has been able to bear weight the entire time.  Pain is worse if he goes from resting to walking immediately.  He has not had any drainage from the wounds.  Denies any fevers or chills.  Does state that he believes the swelling is gotten a little better over the last few days.  Denies numbness or tingling.  No other injuries.  Denies any chest pain or shortness of breath.  He has been inconsistently taking ibuprofen's and other over-the-counter medicines.  He reports pain at times keeping him up at night.     History reviewed. No pertinent past medical history.  There are no problems to display for this patient.   Past Surgical History:  Procedure Laterality Date  . JOINT REPLACEMENT    . SHOULDER SURGERY         Home Medications    Prior to Admission medications   Medication Sig Start Date End Date Taking? Authorizing Provider  fluticasone (FLONASE) 50 MCG/ACT nasal spray Place 1 spray into both nostrils daily for 10 days. 03/02/19 03/12/19  Wieters, Hallie C, PA-C  HYDROcodone-acetaminophen (NORCO/VICODIN) 5-325 MG tablet Take 1-2 tablets by mouth at bedtime. 11/28/19   Joffrey Kerce, Veryl Speak, PA-C  levofloxacin (LEVAQUIN) 500 MG tablet Take  1 tablet (500 mg total) by mouth daily. 01/20/19   Mardella Layman, MD  meloxicam (MOBIC) 15 MG tablet Take 1 tablet (15 mg total) by mouth daily for 14 days. 11/28/19 12/12/19  Saajan Willmon, Veryl Speak, PA-C  sildenafil (VIAGRA) 50 MG tablet Take 1 tablet (50 mg total) by mouth daily as needed for erectile dysfunction (One hour before sexual activity.). 01/20/19   Mardella Layman, MD    Family History History reviewed. No pertinent family history.  Social History Social History   Tobacco Use  . Smoking status: Never Smoker  . Smokeless tobacco: Never Used  Substance Use Topics  . Alcohol use: No  . Drug use: No     Allergies   Patient has no known allergies.   Review of Systems Review of Systems   Physical Exam Triage Vital Signs ED Triage Vitals  Enc Vitals Group     BP 11/28/19 1058 140/88     Pulse Rate 11/28/19 1058 68     Resp 11/28/19 1058 16     Temp 11/28/19 1058 98 F (36.7 C)     Temp src --      SpO2 11/28/19 1058 100 %     Weight --      Height --  Head Circumference --      Peak Flow --      Pain Score 11/28/19 1056 9     Pain Loc --      Pain Edu? --      Excl. in GC? --    No data found.  Updated Vital Signs BP 140/88   Pulse 68   Temp 98 F (36.7 C)   Resp 16   SpO2 100%   Visual Acuity Right Eye Distance:   Left Eye Distance:   Bilateral Distance:    Right Eye Near:   Left Eye Near:    Bilateral Near:     Physical Exam Vitals and nursing note reviewed.  Constitutional:      Appearance: He is well-developed. He is not ill-appearing.  HENT:     Head: Normocephalic and atraumatic.  Eyes:     Conjunctiva/sclera: Conjunctivae normal.  Cardiovascular:     Rate and Rhythm: Normal rate.     Heart sounds: No murmur heard.   Pulmonary:     Effort: Pulmonary effort is normal. No respiratory distress.  Musculoskeletal:     Cervical back: Neck supple.     Comments: Left lower extremity: Well-healing abrasions and cuts just inferior to the left  calf.  No drainage or surrounding erythema.  There is mild swelling along the posterior aspect of the left lower leg in the distribution of the Achilles tendon.  There is also mild dorsal foot swelling of the left foot with some tenderness.  No erythema.  No feverish feeling of the joint.  Distal pulse 2+.  Cap refill less than 2 seconds.  Sensation intact.  There is pain with terminal dorsiflexion and plantar flexion of the left foot.  Patient reports this is in the Achilles tendon and in the heel.  Thompson's test is intact.  No swelling above the calf.  No significant anterior pain or swelling of the left lower extremity.  Skin:    General: Skin is warm and dry.  Neurological:     Mental Status: He is alert.      UC Treatments / Results  Labs (all labs ordered are listed, but only abnormal results are displayed) Labs Reviewed - No data to display  EKG   Radiology No results found.  Procedures Procedures (including critical care time)  Medications Ordered in UC Medications - No data to display  Initial Impression / Assessment and Plan / UC Course  I have reviewed the triage vital signs and the nursing notes.  Pertinent labs & imaging results that were available during my care of the patient were reviewed by me and considered in my medical decision making (see chart for details).     #Left Achilles tendinitis Patient is a 59 year old presenting with what is most consistent with a tendinitis of the left Achilles and possibly of the peroneal tendons as well.  He is afebrile and given duration of symptoms, doubt infection at this time.  Will place him in a cam walker, switch him to Mobic once a day and give a very short course of narcotic pain medicine to take at night.  Discussed ice and elevation.  Directed him to follow-up with the sports medicine group on Monday morning.  Discussed case and patient examined with Dr. Tracie Harrier, attending physician on shift.  Discussed the plan of  care with the patient and patient's sister, both parties verbalized agreement understanding plan of care.   Final Clinical Impressions(s) / UC Diagnoses   Final  diagnoses:  Tendonitis, Achilles, left     Discharge Instructions     Wear the boot  Medications as prescribed, and only the medications prescribed -The Mobic daily -Take 1-2 of the Norco/Vicodin at night only.  This may make you sleepy so do not drive, operate machinery or drink alcohol within 8 hours of taking  Call the sports medicine group Monday morning for follow-up next week.  [phone]      ED Prescriptions    Medication Sig Dispense Auth. Provider   meloxicam (MOBIC) 15 MG tablet Take 1 tablet (15 mg total) by mouth daily for 14 days. 14 tablet Yasmina Chico, Veryl Speak, PA-C   HYDROcodone-acetaminophen (NORCO/VICODIN) 5-325 MG tablet Take 1-2 tablets by mouth at bedtime. 6 tablet Evia Goldsmith, Veryl Speak, PA-C     I have reviewed the PDMP during this encounter.   Hermelinda Medicus, PA-C 11/28/19 1207    Maan Zarcone, Veryl Speak, PA-C 11/28/19 1207

## 2019-12-01 ENCOUNTER — Ambulatory Visit (INDEPENDENT_AMBULATORY_CARE_PROVIDER_SITE_OTHER): Payer: Medicare HMO | Admitting: Sports Medicine

## 2019-12-01 ENCOUNTER — Other Ambulatory Visit: Payer: Self-pay

## 2019-12-01 VITALS — BP 126/82 | Ht 68.5 in | Wt 152.0 lb

## 2019-12-01 DIAGNOSIS — S8012XA Contusion of left lower leg, initial encounter: Secondary | ICD-10-CM | POA: Diagnosis not present

## 2019-12-01 DIAGNOSIS — W208XXA Other cause of strike by thrown, projected or falling object, initial encounter: Secondary | ICD-10-CM | POA: Diagnosis not present

## 2019-12-01 MED ORDER — CYCLOBENZAPRINE HCL 5 MG PO TABS: 5.0000 mg | ORAL_TABLET | Freq: Every evening | ORAL | 0 refills | Status: DC | PRN

## 2019-12-01 NOTE — Progress Notes (Addendum)
Office Visit Note   Patient: Richard Cabrera           Date of Birth: [birth year]           MRN: [id number] Visit Date: 12/01/2019 Requested by: No referring provider defined for this encounter. PCP: Patient, No Pcp Per  Subjective: CC: Left Calf Pain   HPI: Patient is a 59 year old male presenting to clinic with approximately 2 weeks of left calf pain.  Patient states that a tree fell on his neighbor, and he ran to help.  While helping remove the tree, he was struck in the back of the leg by a tree branch, which caused immediate severe pain.  Initially he tried self-care with soaking his leg and peroxide and rubbing alcohol, with no relief.  He presented to the emergency department 3 days ago, and was given a walking boot.  Patient states that, although his pain has improved significantly since his initial injury, he continues to have extreme difficulty with walking. He endorses needing to go up on his tiptoes when standing due to pain. He denies any other associated injuries.              ROS:   All other systems were reviewed and are negative.  Objective: Vital Signs: BP 126/82   Ht 5' 8.5" (1.74 m)   Wt 152 lb (68.9 kg)   BMI 22.78 kg/m   Physical Exam:  General:  Alert and oriented, in no acute distress. Pulm:  Breathing unlabored. Psy:  Normal mood, congruent affect. Skin: Left calf with nonpitting edema, posterior aspect demonstrates superficial abrasion with overlying scab.  No erythema, no drainage, no purulence. Left calf with swelling extending from approximately level of mid calf throughout left foot.  Reduced strength with dorsiflexion, as well as plantarflexion when compared to right. Significant tenderness palpation along length of the Achilles, most significant proximally around area of mid calf abrasion. Thompson's test does produce plantar flexion of foot, however this is reduced when compared to right. Prone flexion of the knees causes passive plantar flexion of  bilateral feet.    Imaging: Limited extremity ultrasound-left calf: Expansive hypoechoic area seen in the distal gastric musculature, consistent with hematoma, causing significant distortion of muscle fibers.  No obvious tears or stump sign within gastroc musculature.  Achilles tendon visualized in long and short access- appears intact.   Soleus musculature intact.  Impression:  Large hematoma in left mid calf.   Assessment & Plan: 59 year old male presenting to clinic with severe posterior left lower leg pain after being struck by a tree 2 weeks ago.  Ultrasound performed in clinic with extensive hematoma throughout gastroc musculature, however Achilles tendon appears intact.  No evidence of infection on today's examination. -Discussed wound care with patient, and he was encouraged to stop soaking in peroxide and rubbing alcohol as this is likely to cause skin irritation. -Provided with 5/16th inch heel in walking boot lift for further calf rest. -Strict provided with Ace wrap for compression, to be titrated as tolerated. -We will provide Flexeril for muscle spasm and pain control.   -Return to clinic in 2 weeks, at which time ultrasound will be repeated to assure appropriate improvement of hematoma, and further evaluation of gastroc musculature. -Patient no further questions or concerns at this time.  Patient seen and evaluated with the sports medicine fellow.  I agree with the above plan of care.  Patient's ultrasound shows a hematoma in the left calf.  Proceed with treatment as  above and follow-up in 2 weeks for reevaluation and repeat scan.

## 2019-12-01 NOTE — Patient Instructions (Addendum)
We've put a heel lift in the walking boot to help keep the pressure off your calf to encourage healing. There's no need to continue soaking the leg in peroxide or rubbing alcohol. Warm compresses or Ice can be used for pain if needed.  Use the Ace Wrap for compression- You can tighten the compression as your pain allows.   We will see you back in two weeks to make sure the hematoma (Blood collection in the calf) is healing appropriately! If you get any signs of infection before then, please call us immediately.

## 2019-12-02 NOTE — Addendum Note (Signed)
Addended by: Reino Bellis R on: 12/02/2019 09:44 AM   Modules accepted: Level of Service

## 2019-12-15 ENCOUNTER — Ambulatory Visit: Payer: Medicare HMO | Admitting: Sports Medicine

## 2019-12-15 ENCOUNTER — Other Ambulatory Visit: Payer: Self-pay

## 2019-12-15 VITALS — BP 132/84 | Ht 68.0 in | Wt 165.0 lb

## 2019-12-15 DIAGNOSIS — S8012XA Contusion of left lower leg, initial encounter: Secondary | ICD-10-CM

## 2019-12-15 DIAGNOSIS — S8012XD Contusion of left lower leg, subsequent encounter: Secondary | ICD-10-CM | POA: Diagnosis not present

## 2019-12-15 NOTE — Progress Notes (Signed)
   Subjective:    Patient ID: Richard Cabrera, male    DOB: 1960-09-17, 59 y.o.   MRN: [id number]  HPI   Patient comes in today for follow-up on a left lower leg hematoma.  He continues to improve.  He is wearing his Ace wrap during the day.  Still wearing his boot intermittently as well.  He is alternating his heel lift between the boot and his tennis shoe.  He has found the muscle relaxers to be helpful also.    Review of Systems    As above Objective:   Physical Exam  Developed, well nourished.  No acute distress  Left lower leg: Previous lower extremity swelling has resolved.  Minimal tenderness to palpation of the calf and the Achilles.  Good strength.  His abrasion is well-healed with no signs of infection.  Neurovascularly intact distally.  Still walking with a slight limp.  Brief bedside MSK shows near complete resolution of the previous left lower leg hematoma.       Assessment & Plan:   Resolving left lower leg hematoma  Patient will continue with compression either in the form of a compression sleeve or Ace wrap until complete symptom resolution.  I want him to discontinue his cam walker completely.  He may still continue with his heel lift in his tennis shoe.  He may start to increase his activity as tolerated and as long as he is able to return to his previous preinjury level of activity, he may follow-up with me as needed.

## 2019-12-15 NOTE — Patient Instructions (Signed)
  Your leg looks a lot better today.  I want you to stop using your walking boot but continue using your heel lift in your shoe.  You may also wear a compression sleeve instead of the Ace wrap during the day.  As long as you continue to improve and you are able to get back to your preinjury level of activity, you can see me as needed in the future.

## 2020-02-29 DIAGNOSIS — N430 Encysted hydrocele: Secondary | ICD-10-CM | POA: Diagnosis not present

## 2020-02-29 DIAGNOSIS — I861 Scrotal varices: Secondary | ICD-10-CM | POA: Diagnosis not present

## 2020-02-29 DIAGNOSIS — N5201 Erectile dysfunction due to arterial insufficiency: Secondary | ICD-10-CM | POA: Diagnosis not present

## 2020-02-29 DIAGNOSIS — Z125 Encounter for screening for malignant neoplasm of prostate: Secondary | ICD-10-CM | POA: Diagnosis not present

## 2020-04-13 ENCOUNTER — Ambulatory Visit (HOSPITAL_COMMUNITY): Payer: Medicare HMO

## 2020-04-13 ENCOUNTER — Other Ambulatory Visit: Payer: Self-pay

## 2020-04-13 ENCOUNTER — Ambulatory Visit (INDEPENDENT_AMBULATORY_CARE_PROVIDER_SITE_OTHER): Payer: Medicare HMO

## 2020-04-13 ENCOUNTER — Encounter (HOSPITAL_COMMUNITY): Payer: Self-pay | Admitting: *Deleted

## 2020-04-13 ENCOUNTER — Ambulatory Visit (HOSPITAL_COMMUNITY)
Admission: EM | Admit: 2020-04-13 | Discharge: 2020-04-13 | Disposition: A | Discharge status: Home / Self Care | Payer: Medicare HMO | Attending: Family Medicine | Admitting: Family Medicine

## 2020-04-13 DIAGNOSIS — S299XXA Unspecified injury of thorax, initial encounter: Secondary | ICD-10-CM | POA: Diagnosis not present

## 2020-04-13 DIAGNOSIS — J984 Other disorders of lung: Secondary | ICD-10-CM | POA: Diagnosis not present

## 2020-04-13 DIAGNOSIS — R918 Other nonspecific abnormal finding of lung field: Secondary | ICD-10-CM | POA: Diagnosis not present

## 2020-04-13 DIAGNOSIS — S20212A Contusion of left front wall of thorax, initial encounter: Secondary | ICD-10-CM

## 2020-04-13 MED ORDER — HYDROCODONE-ACETAMINOPHEN 5-325 MG PO TABS: 1.0000 | ORAL_TABLET | Freq: Four times a day (QID) | ORAL | 0 refills | Status: DC | PRN
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
## 2020-04-13 NOTE — Discharge Instructions (Addendum)

## 2020-04-13 NOTE — ED Provider Notes (Addendum)
[facility]   [id number] 04/13/20 Arrival Time: 0808  ASSESSMENT & PLAN:  1. Rib contusion, left, initial encounter     I have personally viewed the imaging studies ordered this visit. No appreciable rib fx. No pneumothorax.  Has ibuprofen at home to use TID with food. Activities as tolerated. As needed: Meds ordered this encounter  Medications  . HYDROcodone-acetaminophen (NORCO/VICODIN) 5-325 MG tablet    Sig: Take 1 tablet by mouth every 6 (six) hours as needed for moderate pain or severe pain.    Dispense:  12 tablet    Refill:  0    Orders Placed This Encounter  Procedures  . DG Ribs Unilateral W/Chest Left    Recommend:  Follow-up Information    [facility].   Why: If worsening or failing to improve as anticipated. Contact information: [address] [place] [address] [phone]              [place] Controlled Substances Registry consulted for this patient. I feel the risk/benefit ratio today is favorable for proceeding with this prescription for a controlled substance. Medication sedation precautions given.  Reviewed expectations re: course of current medical issues. Questions answered. Outlined signs and symptoms indicating need for more acute intervention. Patient verbalized understanding. After Visit Summary given.  SUBJECTIVE: History from: patient. Richard Cabrera is a 59 y.o. male who reports L sided rib pain s/p falling from [place] three days ago. L sided pain since; not worsening except for stiffness. No resp difficulties. Ambulatory without difficulty. Soaking in epsom salts with mild help. No abd pain.  Past Surgical History:  Procedure Laterality Date  . JOINT REPLACEMENT    . SHOULDER SURGERY        OBJECTIVE:  Vitals:   04/13/20 0844  BP: 119/76  Pulse: 72  Resp: 20  Temp: (!) 97.5 F (36.4 C)  TempSrc: Oral  SpO2: 97%    General appearance: alert; no  distress HEENT: [place]; AT Neck: supple with FROM Resp: unlabored respirations; lungs CTAB Chest Wall: very TTP over L side/ribs without bruising or gross deformities Extremities: moves all normally Skin: warm and dry; no visible rashes Neurologic: gait normal Psychological: alert and cooperative; normal mood and affect  Imaging: DG Ribs Unilateral W/Chest Left  Result Date: 04/13/2020 CLINICAL DATA:  Trauma EXAM: LEFT RIBS AND CHEST - 3+ VIEW COMPARISON:  05/14/2013 FINDINGS: Heart and mediastinal contours are within normal limits. Mild hyperinflation of the lungs. Linear scarring in the lung bases. No confluent opacities or effusions. No acute bony abnormality. No visible displaced rib fracture. No pneumothorax. IMPRESSION: No acute cardiopulmonary disease. Electronically Signed   By: Charlett Nose M.D.   On: 04/13/2020 09:06      No Known Allergies  History reviewed. No pertinent past medical history. Social History   Socioeconomic History  . Marital status: Single    Spouse name: Not on file  . Number of children: Not on file  . Years of education: Not on file  . Highest education level: Not on file  Occupational History  . Not on file  Tobacco Use  . Smoking status: Never Smoker  . Smokeless tobacco: Never Used  Substance and Sexual Activity  . Alcohol use: No  . Drug use: No  . Sexual activity: Not on file  Other Topics Concern  . Not on file  Social History Narrative  . Not on file   Social Determinants of Health   Financial  Resource Strain: Not on file  Food Insecurity: Not on file  Transportation Needs: Not on file  Physical Activity: Not on file  Stress: Not on file  Social Connections: Not on file   History reviewed. No pertinent family history. Past Surgical History:  Procedure Laterality Date  . JOINT REPLACEMENT    . SHOULDER SURGERY        Mardella Layman, MD 04/13/20 1115    Mardella Layman, MD 04/13/20 [phone]

## 2021-02-20 DIAGNOSIS — Z125 Encounter for screening for malignant neoplasm of prostate: Secondary | ICD-10-CM | POA: Diagnosis not present

## 2021-02-27 DIAGNOSIS — N430 Encysted hydrocele: Secondary | ICD-10-CM | POA: Diagnosis not present

## 2021-02-27 DIAGNOSIS — N5201 Erectile dysfunction due to arterial insufficiency: Secondary | ICD-10-CM | POA: Diagnosis not present

## 2021-02-27 DIAGNOSIS — I861 Scrotal varices: Secondary | ICD-10-CM | POA: Diagnosis not present

## 2021-05-08 IMAGING — DX DG RIBS W/ CHEST 3+V*L*
5 series · 5 of 5 positions shown · non-contrast
Comparison: 05/14/2013

CLINICAL DATA: Trauma

EXAM:
LEFT RIBS AND CHEST - 3+ VIEW

[chest pa]
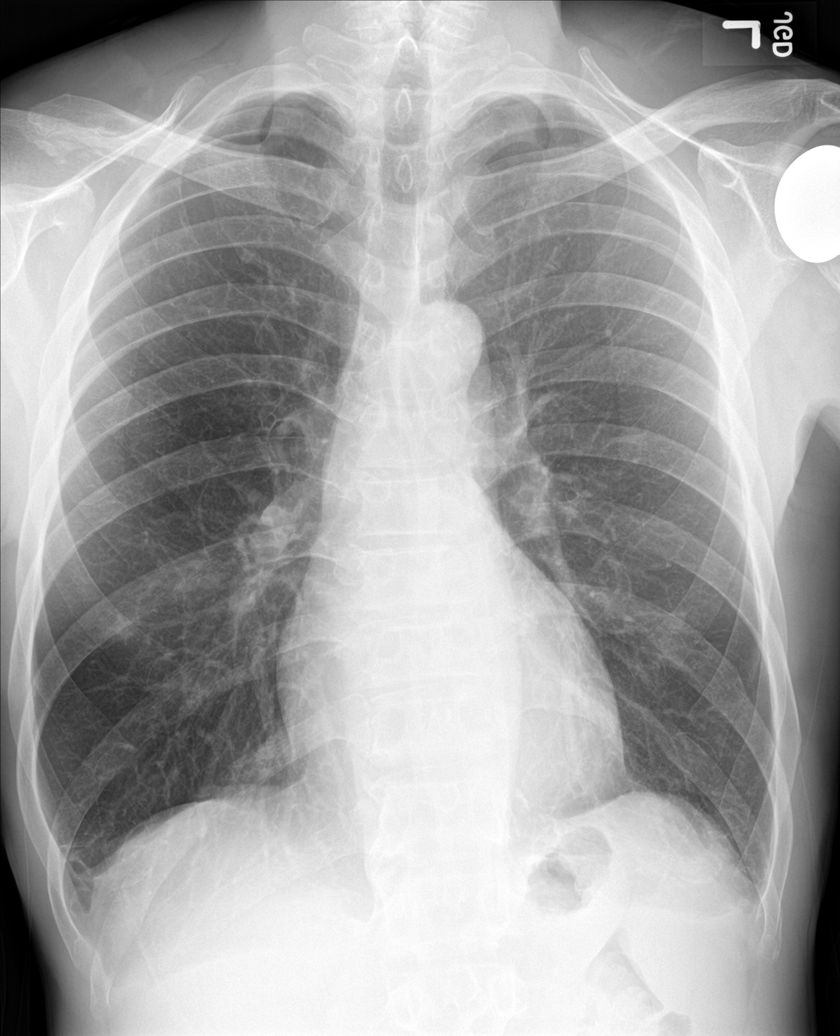

[rib obl (1 of 2)]
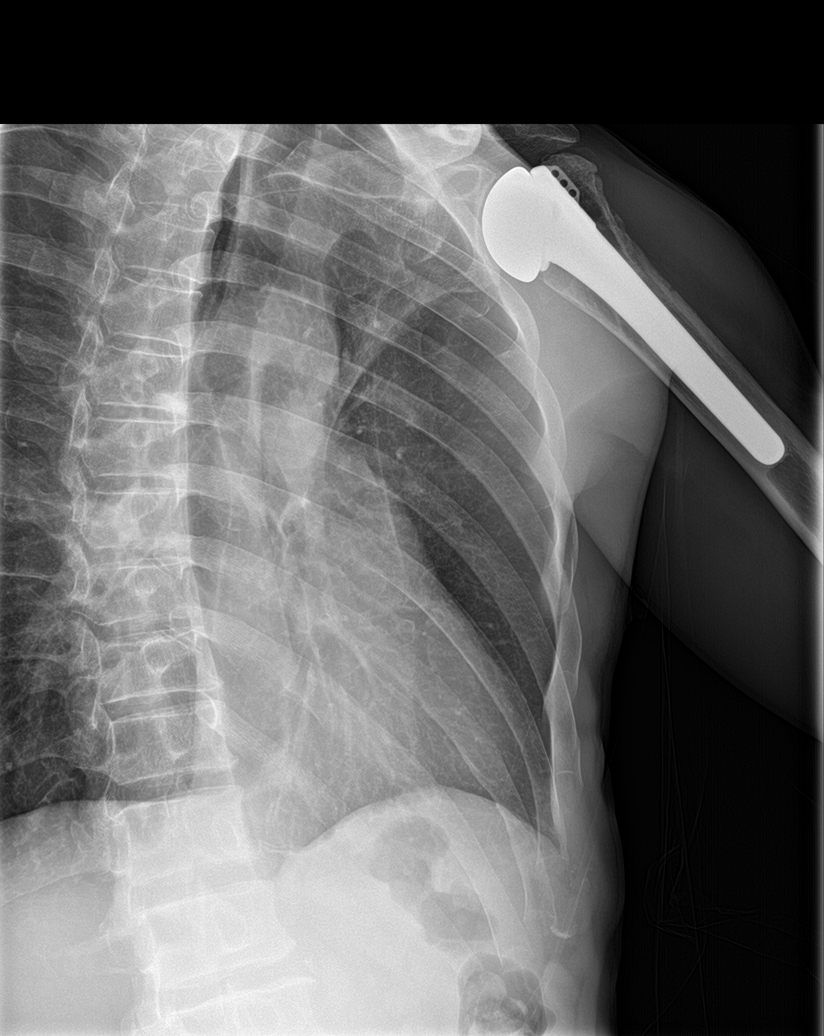

[rib obl (2 of 2)]
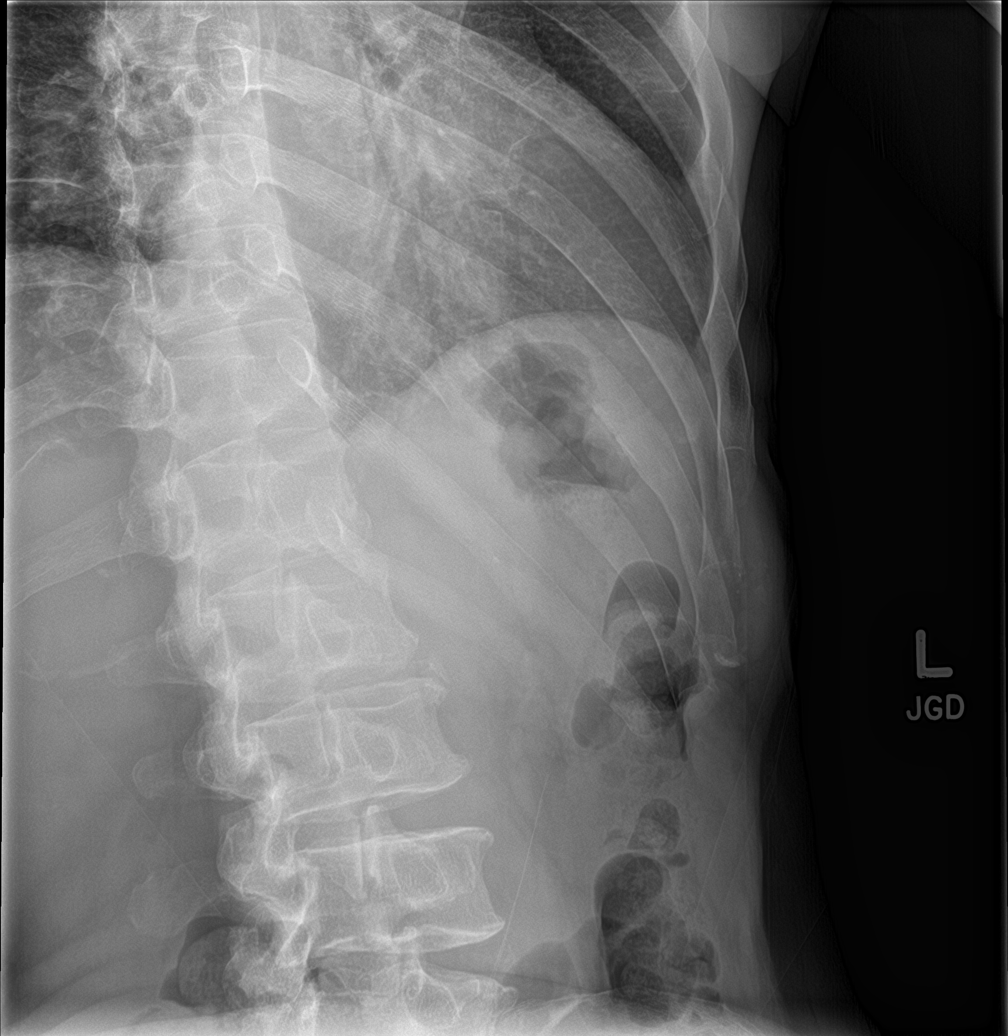

[rib ap (1 of 2)]
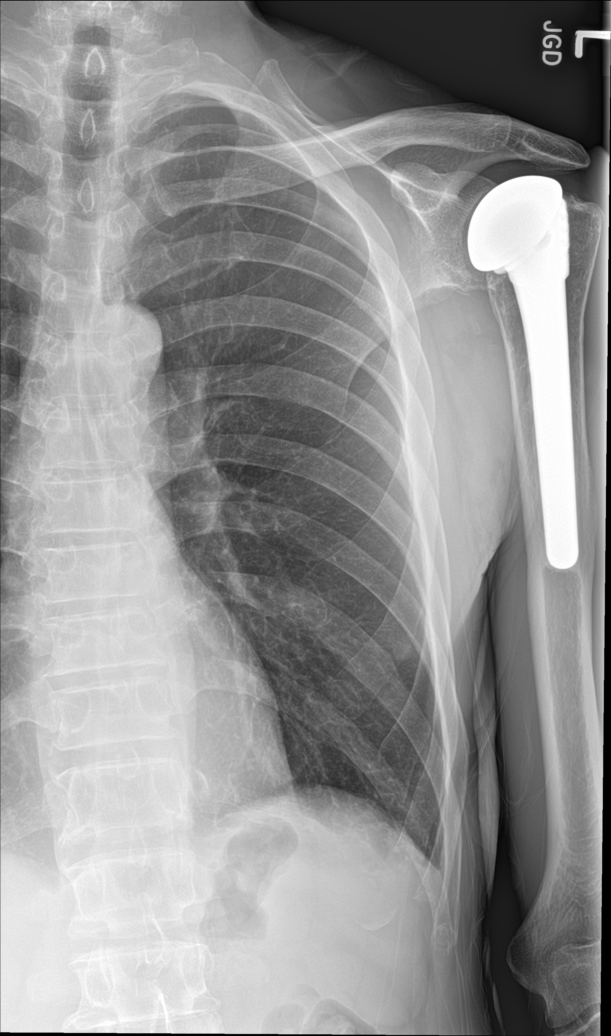

[rib ap (2 of 2)]
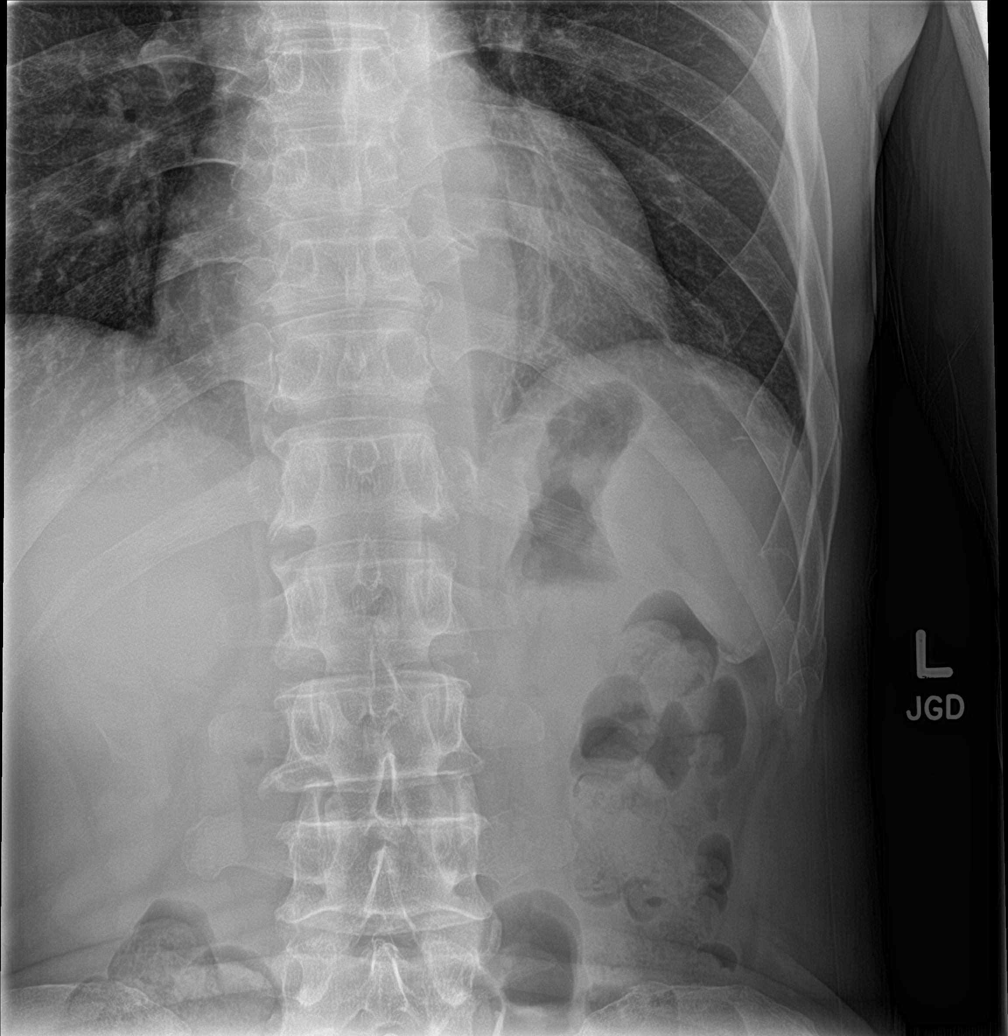

[5 of 5 positions shown; findings below may reference images not displayed]

FINDINGS: Heart and mediastinal contours are within normal limits. Mild
hyperinflation of the lungs. Linear scarring in the lung bases. No
confluent opacities or effusions. No acute bony abnormality. No
visible displaced rib fracture. No pneumothorax.
IMPRESSION: No acute cardiopulmonary disease.

## 2021-11-17 DIAGNOSIS — R102 Pelvic and perineal pain: Secondary | ICD-10-CM | POA: Diagnosis not present

## 2021-11-17 DIAGNOSIS — N433 Hydrocele, unspecified: Secondary | ICD-10-CM | POA: Diagnosis not present

## 2021-11-17 DIAGNOSIS — N5082 Scrotal pain: Secondary | ICD-10-CM | POA: Diagnosis not present

## 2021-11-17 DIAGNOSIS — N430 Encysted hydrocele: Secondary | ICD-10-CM | POA: Diagnosis not present

## 2022-02-23 DIAGNOSIS — Z125 Encounter for screening for malignant neoplasm of prostate: Secondary | ICD-10-CM | POA: Diagnosis not present

## 2022-02-23 DIAGNOSIS — N5201 Erectile dysfunction due to arterial insufficiency: Secondary | ICD-10-CM | POA: Diagnosis not present

## 2022-02-23 DIAGNOSIS — N430 Encysted hydrocele: Secondary | ICD-10-CM | POA: Diagnosis not present

## 2022-06-18 DIAGNOSIS — R948 Abnormal results of function studies of other organs and systems: Secondary | ICD-10-CM | POA: Diagnosis not present

## 2022-06-25 DIAGNOSIS — N5201 Erectile dysfunction due to arterial insufficiency: Secondary | ICD-10-CM | POA: Diagnosis not present

## 2022-06-25 DIAGNOSIS — N430 Encysted hydrocele: Secondary | ICD-10-CM | POA: Diagnosis not present

## 2022-08-07 DIAGNOSIS — H52223 Regular astigmatism, bilateral: Secondary | ICD-10-CM | POA: Diagnosis not present

## 2022-08-07 DIAGNOSIS — H524 Presbyopia: Secondary | ICD-10-CM | POA: Diagnosis not present

## 2022-08-07 DIAGNOSIS — H5203 Hypermetropia, bilateral: Secondary | ICD-10-CM | POA: Diagnosis not present

## 2022-08-07 DIAGNOSIS — Z135 Encounter for screening for eye and ear disorders: Secondary | ICD-10-CM | POA: Diagnosis not present

## 2022-08-20 DIAGNOSIS — Z0000 Encounter for general adult medical examination without abnormal findings: Secondary | ICD-10-CM | POA: Diagnosis not present

## 2022-08-20 DIAGNOSIS — Z125 Encounter for screening for malignant neoplasm of prostate: Secondary | ICD-10-CM | POA: Diagnosis not present

## 2022-08-20 DIAGNOSIS — Z1331 Encounter for screening for depression: Secondary | ICD-10-CM | POA: Diagnosis not present

## 2022-08-20 DIAGNOSIS — K4090 Unilateral inguinal hernia, without obstruction or gangrene, not specified as recurrent: Secondary | ICD-10-CM | POA: Diagnosis not present

## 2022-08-20 DIAGNOSIS — N390 Urinary tract infection, site not specified: Secondary | ICD-10-CM | POA: Diagnosis not present

## 2022-08-20 DIAGNOSIS — Z1339 Encounter for screening examination for other mental health and behavioral disorders: Secondary | ICD-10-CM | POA: Diagnosis not present

## 2022-08-30 DIAGNOSIS — H35372 Puckering of macula, left eye: Secondary | ICD-10-CM | POA: Diagnosis not present

## 2022-08-30 DIAGNOSIS — H33322 Round hole, left eye: Secondary | ICD-10-CM | POA: Diagnosis not present

## 2022-08-30 DIAGNOSIS — H4322 Crystalline deposits in vitreous body, left eye: Secondary | ICD-10-CM | POA: Diagnosis not present

## 2022-08-30 DIAGNOSIS — H31011 Macula scars of posterior pole (postinflammatory) (post-traumatic), right eye: Secondary | ICD-10-CM | POA: Diagnosis not present

## 2022-08-30 DIAGNOSIS — H43813 Vitreous degeneration, bilateral: Secondary | ICD-10-CM | POA: Diagnosis not present

## 2022-09-12 DIAGNOSIS — K4090 Unilateral inguinal hernia, without obstruction or gangrene, not specified as recurrent: Secondary | ICD-10-CM | POA: Diagnosis not present

## 2022-09-20 DIAGNOSIS — K4090 Unilateral inguinal hernia, without obstruction or gangrene, not specified as recurrent: Secondary | ICD-10-CM | POA: Diagnosis not present

## 2022-10-31 ENCOUNTER — Encounter: Payer: Self-pay | Admitting: Internal Medicine

## 2023-01-18 DIAGNOSIS — F5101 Primary insomnia: Secondary | ICD-10-CM | POA: Diagnosis not present

## 2023-01-18 DIAGNOSIS — K4090 Unilateral inguinal hernia, without obstruction or gangrene, not specified as recurrent: Secondary | ICD-10-CM | POA: Diagnosis not present

## 2023-02-18 DIAGNOSIS — F5101 Primary insomnia: Secondary | ICD-10-CM | POA: Diagnosis not present

## 2023-02-18 DIAGNOSIS — Z23 Encounter for immunization: Secondary | ICD-10-CM | POA: Diagnosis not present

## 2023-02-18 DIAGNOSIS — K4090 Unilateral inguinal hernia, without obstruction or gangrene, not specified as recurrent: Secondary | ICD-10-CM | POA: Diagnosis not present

## 2023-08-21 DIAGNOSIS — Z0000 Encounter for general adult medical examination without abnormal findings: Secondary | ICD-10-CM | POA: Diagnosis not present

## 2023-08-21 DIAGNOSIS — Z125 Encounter for screening for malignant neoplasm of prostate: Secondary | ICD-10-CM | POA: Diagnosis not present

## 2023-08-21 DIAGNOSIS — R7301 Impaired fasting glucose: Secondary | ICD-10-CM | POA: Diagnosis not present

## 2023-08-21 DIAGNOSIS — R7989 Other specified abnormal findings of blood chemistry: Secondary | ICD-10-CM | POA: Diagnosis not present

## 2023-08-29 DIAGNOSIS — R82998 Other abnormal findings in urine: Secondary | ICD-10-CM | POA: Diagnosis not present

## 2023-08-29 DIAGNOSIS — R7301 Impaired fasting glucose: Secondary | ICD-10-CM | POA: Diagnosis not present

## 2023-08-29 DIAGNOSIS — F5101 Primary insomnia: Secondary | ICD-10-CM | POA: Diagnosis not present

## 2023-08-29 DIAGNOSIS — Z0000 Encounter for general adult medical examination without abnormal findings: Secondary | ICD-10-CM | POA: Diagnosis not present

## 2023-08-29 DIAGNOSIS — K4090 Unilateral inguinal hernia, without obstruction or gangrene, not specified as recurrent: Secondary | ICD-10-CM | POA: Diagnosis not present

## 2023-08-29 DIAGNOSIS — Z1331 Encounter for screening for depression: Secondary | ICD-10-CM | POA: Diagnosis not present

## 2023-08-29 DIAGNOSIS — Z1339 Encounter for screening examination for other mental health and behavioral disorders: Secondary | ICD-10-CM | POA: Diagnosis not present

## 2023-09-15 ENCOUNTER — Encounter (HOSPITAL_COMMUNITY): Payer: Self-pay

## 2023-09-15 ENCOUNTER — Ambulatory Visit (HOSPITAL_COMMUNITY)
Admission: EM | Admit: 2023-09-15 | Discharge: 2023-09-15 | Disposition: A | Discharge status: Home / Self Care | Attending: Nurse Practitioner | Admitting: Nurse Practitioner

## 2023-09-15 DIAGNOSIS — S61211A Laceration without foreign body of left index finger without damage to nail, initial encounter: Secondary | ICD-10-CM

## 2023-09-15 DIAGNOSIS — Z23 Encounter for immunization: Secondary | ICD-10-CM

## 2023-09-15 MED ORDER — TETANUS-DIPHTH-ACELL PERTUSSIS 5-2.5-18.5 LF-MCG/0.5 IM SUSY
0.5000 mL | PREFILLED_SYRINGE | Freq: Once | INTRAMUSCULAR | Status: AC
  Administered 2023-09-15: 0.5 mL via INTRAMUSCULAR

## 2023-09-15 MED ORDER — LIDOCAINE HCL (PF) 2 % IJ SOLN
INTRAMUSCULAR | Status: AC
  Filled 2023-09-15: qty 5

## 2023-09-15 MED ORDER — IBUPROFEN 800 MG PO TABS: 800.0000 mg | ORAL_TABLET | Freq: Three times a day (TID) | ORAL | 0 refills | Status: AC | PRN

## 2023-09-15 MED ORDER — TETANUS-DIPHTH-ACELL PERTUSSIS 5-2.5-18.5 LF-MCG/0.5 IM SUSY
PREFILLED_SYRINGE | INTRAMUSCULAR | Status: AC
  Filled 2023-09-15: qty 0.5

## 2023-09-15 NOTE — ED Triage Notes (Addendum)
 Patient states he has a laceration from a nail that occurred last night.  Patient states he has been taking Tylenol  for pain and last had at 0200 today.  Patient states he does not know when his last tetanus was.

## 2023-09-15 NOTE — ED Provider Notes (Signed)
 [facility]    CSN: [id number] Arrival date & time: 09/15/23  1255      History   Chief Complaint Chief Complaint  Patient presents with   Laceration    HPI Richard Cabrera is a 63 y.o. male.   Richard Cabrera is a 63 y.o. male that with a laceration on their index finger of the left hand sustained last night while working with his horses. The injury occurred at approximately 8:30-9:00 PM last night when the patient's finger was cut on a nail. The patient reports pain in the affected finger but denies any numbness or tingling. The patient is unsure of their last tetanus shot date.  The following portions of the patient's history were reviewed and updated as appropriate: allergies, current medications, past family history, past medical history, past social history, past surgical history, and problem list.    History reviewed. No pertinent past medical history.  There are no active problems to display for this patient.   Past Surgical History:  Procedure Laterality Date   JOINT REPLACEMENT     SHOULDER SURGERY         Home Medications    Prior to Admission medications   Medication Sig Start Date End Date Taking? Authorizing Provider  ibuprofen (ADVIL) 800 MG tablet Take 1 tablet (800 mg total) by mouth every 8 (eight) hours as needed (pain). 09/15/23  Yes Artice Holohan, FNP  sildenafil  (VIAGRA ) 50 MG tablet Take 1 tablet (50 mg total) by mouth daily as needed for erectile dysfunction (One hour before sexual activity.). 01/20/19   Afton Albright, MD    Family History Family History  Family history unknown: Yes    Social History Social History   Tobacco Use   Smoking status: Some Days    Types: Cigarettes   Smokeless tobacco: Never  Vaping Use   Vaping status: Never Used  Substance Use Topics   Alcohol  use: No   Drug use: No     Allergies   Patient has no known allergies.   Review of Systems Review of Systems  Skin:  Positive for wound.   Neurological:  Negative for weakness and numbness.  All other systems reviewed and are negative.    Physical Exam Triage Vital Signs ED Triage Vitals  Encounter Vitals Group     BP 09/15/23 1409 117/76     Systolic BP Percentile --      Diastolic BP Percentile --      Pulse Rate 09/15/23 1409 63     Resp 09/15/23 1409 14     Temp 09/15/23 1409 97.9 F (36.6 C)     Temp Source 09/15/23 1409 Oral     SpO2 09/15/23 1409 98 %     Weight --      Height --      Head Circumference --      Peak Flow --      Pain Score 09/15/23 1405 7     Pain Loc --      Pain Education --      Exclude from Growth Chart --    No data found.  Updated Vital Signs BP 117/76 (BP Location: Right Arm)   Pulse 63   Temp 97.9 F (36.6 C) (Oral)   Resp 14   SpO2 98%   Visual Acuity Right Eye Distance:   Left Eye Distance:   Bilateral Distance:    Right Eye Near:   Left Eye Near:    Bilateral Near:  Physical Exam Vitals reviewed.  Constitutional:      General: He is not in acute distress.    Appearance: Normal appearance. He is not toxic-appearing.  HENT:     Head: Normocephalic.     Mouth/Throat:     Mouth: Mucous membranes are moist.  Eyes:     Conjunctiva/sclera: Conjunctivae normal.  Cardiovascular:     Rate and Rhythm: Normal rate and regular rhythm.     Heart sounds: Normal heart sounds.  Pulmonary:     Effort: Pulmonary effort is normal.     Breath sounds: Normal breath sounds.  Musculoskeletal:        General: Normal range of motion.  Skin:    General: Skin is warm and dry.     Findings: Laceration present.  Neurological:     General: No focal deficit present.     Mental Status: He is alert and oriented to person, place, and time.     Sensory: Sensation is intact. No sensory deficit.     Motor: Motor function is intact. No weakness.      UC Treatments / Results  Labs (all labs ordered are listed, but only abnormal results are displayed) Labs Reviewed - No data  to display  EKG   Radiology No results found.  Procedures Laceration Repair  Date/Time: 09/15/2023 4:05 PM  Performed by: Maryruth Sol, FNP Authorized by: Maryruth Sol, FNP   Consent:    Consent obtained:  Verbal   Consent given by:  Patient   Risks, benefits, and alternatives were discussed: yes     Risks discussed:  Infection, pain, poor cosmetic result and poor wound healing Universal protocol:    Patient identity confirmed:  Verbally with patient and arm band Anesthesia:    Anesthesia method:  Local infiltration   Local anesthetic:  Lidocaine  2% w/o epi Laceration details:    Location:  Finger   Finger location:  L index finger Treatment:    Area cleansed with:  Saline   Amount of cleaning:  Standard Skin repair:    Repair method:  Sutures   Suture size: 3-0 Ethilon & 4-0 Prolene.   Number of sutures:  14 (Prolene x8, Ethilon x6) Approximation:    Approximation:  Close Repair type:    Repair type:  Intermediate Post-procedure details:    Dressing:  Non-adherent dressing (coban)   Procedure completion:  Tolerated well, no immediate complications  (including critical care time)  Medications Ordered in UC Medications  Tdap (BOOSTRIX) injection 0.5 mL (0.5 mLs Intramuscular Given 09/15/23 1557)    Initial Impression / Assessment and Plan / UC Course  I have reviewed the triage vital signs and the nursing notes.  Pertinent labs & imaging results that were available during my care of the patient were reviewed by me and considered in my medical decision making (see chart for details).    Patient presents with a laceration to the right index finger sustained the previous evening while handling horses, likely caused by contact with a nail. The patient is right-handed and reports pain without numbness or tingling. On examination, the laceration has irregular but approximable edges, suggesting the need for suturing. The wound was cleaned and anesthetized, and  sutures were placed to ensure proper closure. A tetanus vaccine was administered due to unknown vaccination status. A digital image of the wound pre-and-post treatment was taken for documentation.  Return precautions reviewed.  Return to clinic in 1 week for wound reevaluation and suture removal.  Today's evaluation has  revealed no signs of a dangerous process. Discussed diagnosis with patient and/or guardian. Patient and/or guardian aware of their diagnosis, possible red flag symptoms to watch out for and need for close follow up. Patient and/or guardian understands verbal and written discharge instructions. Patient and/or guardian comfortable with plan and disposition.  Patient and/or guardian has a clear mental status at this time, good insight into illness (after discussion and teaching) and has clear judgment to make decisions regarding their care  Documentation was completed with the aid of voice recognition software. Transcription may contain typographical errors.  Final Clinical Impressions(s) / UC Diagnoses   Final diagnoses:  Laceration of left index finger without foreign body without damage to nail, initial encounter     Discharge Instructions      You were seen today for a cut which is also known as a laceration.  A laceration is a type of injury that can go through all layers of the skin and sometimes into the tissue underneath. Some cuts can heal on their own, but others need to be closed to help them heal properly and reduce the risk of infection.  Your cut was treated with stitches. Keep the dressing on for the next 24 hours and do not let it get wet. After 24 hours, you can remove the dressing. Clean the area gently once a day using warm water and antibacterial soap. After cleaning, pat the area dry with a clean towel. Do not rub the wound, scratch it, or pick at it. Avoid using disinfectants, antiseptics, ointments, creams, or liquid medicines on the wound.  Keep the area  covered with a non-stick dressing for the next couple of days. If you're at home and not doing anything that could get the wound dirty, you can leave it uncovered, as air helps promote healing.  Please return here or follow up with your healthcare provider in one week to have the stitches removed. If you notice increased redness, swelling, pus, or worsening pain, contact your provider right away.   ED Prescriptions     Medication Sig Dispense Auth. Provider   ibuprofen (ADVIL) 800 MG tablet Take 1 tablet (800 mg total) by mouth every 8 (eight) hours as needed (pain). 21 tablet Maryruth Sol, FNP      PDMP not reviewed this encounter.   Maryruth Sol, [place] 09/15/23 [id number]

## 2023-09-15 NOTE — Discharge Instructions (Addendum)
 You were seen today for a cut which is also known as a laceration.  A laceration is a type of injury that can go through all layers of the skin and sometimes into the tissue underneath. Some cuts can heal on their own, but others need to be closed to help them heal properly and reduce the risk of infection.  Your cut was treated with stitches. Keep the dressing on for the next 24 hours and do not let it get wet. After 24 hours, you can remove the dressing. Clean the area gently once a day using warm water  and antibacterial soap. After cleaning, pat the area dry with a clean towel. Do not rub the wound, scratch it, or pick at it. Avoid using disinfectants, antiseptics, ointments, creams, or liquid medicines on the wound.  Keep the area covered with a non-stick dressing for the next couple of days. If you're at home and not doing anything that could get the wound dirty, you can leave it uncovered, as air helps promote healing.  Please return here or follow up with your healthcare provider in one week to have the stitches removed. If you notice increased redness, swelling, pus, or worsening pain, contact your provider right away.

## 2023-09-22 ENCOUNTER — Ambulatory Visit (HOSPITAL_COMMUNITY): Admission: EM | Admit: 2023-09-22 | Discharge: 2023-09-22 | Disposition: A | Discharge status: Home / Self Care

## 2023-09-22 ENCOUNTER — Encounter (HOSPITAL_COMMUNITY): Payer: Self-pay | Admitting: Emergency Medicine

## 2023-09-22 DIAGNOSIS — Z4802 Encounter for removal of sutures: Secondary | ICD-10-CM

## 2023-09-22 NOTE — ED Provider Notes (Signed)
 [facility]    CSN: [id number] Arrival date & time: 09/22/23  1138      History   Chief Complaint Chief Complaint  Patient presents with   Suture / Staple Removal    HPI Richard Cabrera is a 63 y.o. male.   Patient is here for suture removal.  Patient has a sutured laceration to his left index finger.  He has had some drainage and swelling.  Patient had sutures placed 7 days ago.  He has not had any fever or chills  The history is provided by the patient. No language interpreter was used.  Suture / Staple Removal This is a new problem.    History reviewed. No pertinent past medical history.  There are no active problems to display for this patient.   Past Surgical History:  Procedure Laterality Date   JOINT REPLACEMENT     SHOULDER SURGERY         Home Medications    Prior to Admission medications   Medication Sig Start Date End Date Taking? Authorizing Provider  ibuprofen  (ADVIL ) 800 MG tablet Take 1 tablet (800 mg total) by mouth every 8 (eight) hours as needed (pain). 09/15/23   Murrill, Samantha, FNP  sildenafil  (VIAGRA ) 50 MG tablet Take 1 tablet (50 mg total) by mouth daily as needed for erectile dysfunction (One hour before sexual activity.). 01/20/19   Afton Albright, MD  traZODone (DESYREL) 50 MG tablet 1 tablet at bedtime as needed Orally Once a day for 30 days As needed    [provider]    Family History Family History  Family history unknown: Yes    Social History Social History   Tobacco Use   Smoking status: Some Days    Types: Cigarettes   Smokeless tobacco: Never  Vaping Use   Vaping status: Never Used  Substance Use Topics   Alcohol  use: No   Drug use: No     Allergies   Patient has no known allergies.   Review of Systems Review of Systems  Skin:  Positive for wound.  All other systems reviewed and are negative.    Physical Exam Triage Vital Signs ED Triage Vitals  Encounter Vitals Group     BP  09/22/23 1151 136/71     Systolic BP Percentile --      Diastolic BP Percentile --      Pulse Rate 09/22/23 1151 79     Resp 09/22/23 1151 18     Temp 09/22/23 1151 97.8 F (36.6 C)     Temp Source 09/22/23 1151 Oral     SpO2 09/22/23 1151 98 %     Weight --      Height --      Head Circumference --      Peak Flow --      Pain Score 09/22/23 1149 0     Pain Loc --      Pain Education --      Exclude from Growth Chart --    No data found.  Updated Vital Signs BP 136/71 (BP Location: Left Arm)   Pulse 79   Temp 97.8 F (36.6 C) (Oral)   Resp 18   SpO2 98%   Visual Acuity Right Eye Distance:   Left Eye Distance:   Bilateral Distance:    Right Eye Near:   Left Eye Near:    Bilateral Near:     Physical Exam Vitals reviewed.  Constitutional:  Appearance: Normal appearance.  Musculoskeletal:        General: Swelling present.     Comments: Slight swelling around laceration site, yellow exudate, neurovascular neurosensory intact  Neurological:     General: No focal deficit present.     Mental Status: He is alert.      UC Treatments / Results  Labs (all labs ordered are listed, but only abnormal results are displayed) Labs Reviewed - No data to display  EKG   Radiology No results found.  Procedures Procedures (including critical care time)  Medications Ordered in UC Medications - No data to display  Initial Impression / Assessment and Plan / UC Course  I have reviewed the triage vital signs and the nursing notes.  Pertinent labs & imaging results that were available during my care of the patient were reviewed by me and considered in my medical decision making (see chart for details).     Sutures removed by RN, wound cleaned.  No obvious signs of infection.  Patient counseled on wound care and advised to return if any problems. Final Clinical Impressions(s) / UC Diagnoses   Final diagnoses:  Visit for suture removal   Discharge Instructions       Return if any problems.    ED Prescriptions   None    PDMP not reviewed this encounter.   Sandi Crosby, PA-C 09/22/23 1238

## 2023-09-22 NOTE — ED Triage Notes (Signed)
Patient presents for suture removal.

## 2023-09-22 NOTE — ED Notes (Signed)
 Marcia Setters, [place] evaluated prior to and after removal.

## 2023-09-22 NOTE — Discharge Instructions (Signed)
 Return if any problems.

## 2023-10-08 ENCOUNTER — Encounter: Payer: Self-pay | Admitting: Pediatrics

## 2023-10-22 ENCOUNTER — Ambulatory Visit (AMBULATORY_SURGERY_CENTER)

## 2023-10-22 VITALS — Ht 68.0 in | Wt 144.2 lb

## 2023-10-22 DIAGNOSIS — Z1211 Encounter for screening for malignant neoplasm of colon: Secondary | ICD-10-CM

## 2023-10-22 MED ORDER — NA SULFATE-K SULFATE-MG SULF 17.5-3.13-1.6 GM/177ML PO SOLN: 1.0000 | Freq: Once | ORAL | 0 refills | Status: AC

## 2023-10-22 NOTE — Progress Notes (Signed)
 No egg or soy allergy known to patient  No issues known to pt with past sedation with any surgeries or procedures Patient denies ever being told they had issues or difficulty with intubation  No FH of Malignant Hyperthermia Pt is not on diet pills Pt is not on  home 02  Pt is not on blood thinners  Pt denies issues with constipation  No A fib or A flutter Have any cardiac testing pending--no  LOA: independent  Prep: suprep   Patient's chart reviewed by Norleen Schillings CNRA prior to previsit and patient appropriate for the [facility].  Previsit completed and red dot placed by patient's name on their procedure day (on provider's schedule).     PV completed with patient. Prep instructions sent given at [facility] apt.

## 2023-11-05 NOTE — Progress Notes (Unsigned)
 [facility] History and Physical   Primary Care Physician:  Patient, No Pcp Per   Reason for Procedure:  Colorectal cancer screening  Plan:    Screening colonoscopy     HPI: Richard Cabrera is a 63 y.o. male undergoing screening colonoscopy for colorectal cancer screening.  This is the patient's first colonoscopy.  No family history of colorectal cancer or polyps.  Patient denies change in bowel habits or rectal bleeding at the time of this exam.   No past medical history on file.  Past Surgical History:  Procedure Laterality Date   JOINT REPLACEMENT     SHOULDER SURGERY      Prior to Admission medications   Medication Sig Start Date End Date Taking? Authorizing Provider  ibuprofen  (ADVIL ) 800 MG tablet Take 1 tablet (800 mg total) by mouth every 8 (eight) hours as needed (pain). 09/15/23   Murrill, Samantha, FNP  sildenafil  (VIAGRA ) 50 MG tablet Take 1 tablet (50 mg total) by mouth daily as needed for erectile dysfunction (One hour before sexual activity.). 01/20/19   Rolinda Rogue, MD  traZODone (DESYREL) 50 MG tablet 1 tablet at bedtime as needed Orally Once a day for 30 days As needed    [provider]    Current Outpatient Medications  Medication Sig Dispense Refill   ibuprofen  (ADVIL ) 800 MG tablet Take 1 tablet (800 mg total) by mouth every 8 (eight) hours as needed (pain). 21 tablet 0   sildenafil  (VIAGRA ) 50 MG tablet Take 1 tablet (50 mg total) by mouth daily as needed for erectile dysfunction (One hour before sexual activity.). 10 tablet 0   traZODone (DESYREL) 50 MG tablet 1 tablet at bedtime as needed Orally Once a day for 30 days As needed     No current facility-administered medications for this visit.    Allergies as of 11/07/2023   (No Known Allergies)    Family History  Problem Relation Age of Onset   Colon cancer Neg Hx    Rectal cancer Neg Hx    Stomach cancer Neg Hx     Social History   Socioeconomic History   Marital  status: Single    Spouse name: Not on file   Number of children: Not on file   Years of education: Not on file   Highest education level: Not on file  Occupational History   Not on file  Tobacco Use   Smoking status: Some Days    Types: Cigarettes   Smokeless tobacco: Never  Vaping Use   Vaping status: Never Used  Substance and Sexual Activity   Alcohol  use: No   Drug use: No   Sexual activity: Not on file  Other Topics Concern   Not on file  Social History Narrative   Not on file   Social Drivers of Health   Financial Resource Strain: Not on file  Food Insecurity: Not on file  Transportation Needs: Not on file  Physical Activity: Not on file  Stress: Not on file  Social Connections: Not on file  Intimate Partner Violence: Not on file    Review of Systems:  All other review of systems negative except as mentioned in the HPI.  Physical Exam: Vital signs There were no vitals taken for this visit.  General:   Alert,  Well-developed, well-nourished, pleasant and cooperative in NAD Airway:  Mallampati  Lungs:  Clear throughout to auscultation.   Heart:  Regular rate and rhythm; no murmurs, clicks, rubs,  or gallops. Abdomen:  Soft, nontender and nondistended. Normal bowel sounds.   Neuro/Psych:  Normal mood and affect. A and O x 3  Inocente Hausen, MD [facility]

## 2023-11-07 ENCOUNTER — Encounter: Payer: Self-pay | Admitting: Pediatrics

## 2023-11-07 ENCOUNTER — Ambulatory Visit (AMBULATORY_SURGERY_CENTER): Admitting: Pediatrics

## 2023-11-07 VITALS — BP 126/72 | HR 92 | Temp 97.9°F | Resp 24 | Ht 68.0 in | Wt 144.2 lb

## 2023-11-07 DIAGNOSIS — D123 Benign neoplasm of transverse colon: Secondary | ICD-10-CM

## 2023-11-07 DIAGNOSIS — D125 Benign neoplasm of sigmoid colon: Secondary | ICD-10-CM | POA: Diagnosis not present

## 2023-11-07 DIAGNOSIS — K648 Other hemorrhoids: Secondary | ICD-10-CM | POA: Diagnosis not present

## 2023-11-07 DIAGNOSIS — Z1211 Encounter for screening for malignant neoplasm of colon: Secondary | ICD-10-CM | POA: Diagnosis not present

## 2023-11-07 MED ORDER — SODIUM CHLORIDE 0.9 % IV SOLN: 500.0000 mL | Freq: Once | INTRAVENOUS | Status: DC

## 2023-11-07 MED ORDER — OMEPRAZOLE 40 MG PO CPDR: 40.0000 mg | DELAYED_RELEASE_CAPSULE | Freq: Two times a day (BID) | ORAL | 3 refills | Status: DC

## 2023-11-07 NOTE — Progress Notes (Signed)
 9049:  IV infiltrated.  New R AC 22 g started.  Case contd.  100Mg  infiltrated

## 2023-11-07 NOTE — Patient Instructions (Signed)

## 2023-11-07 NOTE — Op Note (Signed)
 [facility] Patient Name: Richard Cabrera Procedure Date: 11/07/2023 9:43 AM MRN: [id number] Endoscopist: Inocente Hausen , MD, [id number] Age: 63 Referring MD:  Date of Birth: 1961-01-27 Gender: Male Account #: [id number] Procedure:                Colonoscopy Indications:              Screening for colorectal malignant neoplasm, This                            is the patient's first colonoscopy Medicines:                Monitored Anesthesia Care Procedure:                Pre-Anesthesia Assessment:                           - Prior to the procedure, a History and Physical                            was performed, and patient medications and                            allergies were reviewed. The patient's tolerance of                            previous anesthesia was also reviewed. The risks                            and benefits of the procedure and the sedation                            options and risks were discussed with the patient.                            All questions were answered, and informed consent                            was obtained. Prior Anticoagulants: The patient has                            taken no anticoagulant or antiplatelet agents. ASA                            Grade Assessment: II - A patient with mild systemic                            disease. After reviewing the risks and benefits,                            the patient was deemed in satisfactory condition to                            undergo the procedure.  After obtaining informed consent, the colonoscope                            was passed under direct vision. Throughout the                            procedure, the patient's blood pressure, pulse, and                            oxygen saturations were monitored continuously. The                            Olympus Scope [id number] was introduced through the                            anus and advanced to  the cecum, identified by                            appendiceal orifice and ileocecal valve. The                            colonoscopy was performed without difficulty. The                            patient tolerated the procedure well. The quality                            of the bowel preparation was good. The ileocecal                            valve, appendiceal orifice, and rectum were                            photographed. Scope In: 10:01:11 AM Scope Out: 10:20:39 AM Scope Withdrawal Time: 0 hours 15 minutes 22 seconds  Total Procedure Duration: 0 hours 19 minutes 28 seconds  Findings:                 The perianal and digital rectal examinations were                            normal. Pertinent negatives include normal                            sphincter tone and no palpable rectal lesions.                           A 12 mm polyp was found in the transverse colon.                            The polyp was flat and sessile. The polyp was                            removed with a hot snare. Resection and retrieval  were complete.                           A 6 mm polyp was found in the sigmoid colon. The                            polyp was sessile. The polyp was removed with a                            cold snare. Resection and retrieval were complete.                           Internal hemorrhoids were found during retroflexion. Complications:            No immediate complications. Estimated blood loss:                            Minimal. Estimated Blood Loss:     Estimated blood loss was minimal. Impression:               - One 12 mm polyp in the transverse colon, removed                            with a hot snare. Resected and retrieved.                           - One 6 mm polyp in the sigmoid colon, removed with                            a cold snare. Resected and retrieved.                           - Internal hemorrhoids. Recommendation:            - Discharge patient to home (ambulatory).                           - Await pathology results.                           - Repeat colonoscopy for surveillance based on                            pathology results.                           - No aspirin, ibuprofen , naproxen, or other                            non-steroidal anti-inflammatory drugs for 10 days                            after polyp removal.                           - The findings and recommendations were discussed  with the patient.                           - Patient has a contact number available for                            emergencies. The signs and symptoms of potential                            delayed complications were discussed with the                            patient. Return to normal activities tomorrow.                            Written discharge instructions were provided to the                            patient. Inocente Hausen, MD 11/07/2023 10:27:55 AM This report has been signed electronically.

## 2023-11-07 NOTE — Progress Notes (Signed)
 Called to room to assist during endoscopic procedure.  Patient ID and intended procedure confirmed with present staff. Received instructions for my participation in the procedure from the performing physician.

## 2023-11-07 NOTE — Progress Notes (Signed)
 Pt's states no medical or surgical changes since previsit or office visit.

## 2023-11-07 NOTE — Progress Notes (Signed)
 Vss nad trans to pacu

## 2023-11-08 ENCOUNTER — Telehealth: Payer: Self-pay | Admitting: *Deleted

## 2023-11-08 NOTE — Telephone Encounter (Signed)
  Follow up Call-     11/07/2023    8:40 AM  Call back number  Post procedure Call Back phone  # [phone]  Permission to leave phone message Yes     Patient questions:  Do you have a fever, pain , or abdominal swelling? No. Pain Score  0 *  Have you tolerated food without any problems? Yes.    Have you been able to return to your normal activities? Yes.    Do you have any questions about your discharge instructions: Diet   No. Medications  No. Follow up visit  No.  Do you have questions or concerns about your Care? No.  Actions: * If pain score is 4 or above: No action needed, pain <4.

## 2023-11-11 ENCOUNTER — Ambulatory Visit: Payer: Self-pay | Admitting: Pediatrics

## 2023-11-11 LAB — SURGICAL PATHOLOGY

## 2023-11-19 ENCOUNTER — Telehealth: Payer: Self-pay | Admitting: Pediatrics

## 2023-11-19 NOTE — Telephone Encounter (Signed)
 Inbound call from patient's sister requesting a call to discuss omeprazole  medication further. States her and the patient are unsure what medication is for. Patient's sister is requesting a call back at [phone]. Please advise, thank you

## 2023-11-20 NOTE — Telephone Encounter (Signed)
 Patient's sister returned call. I informed Rock that Omeprazole  RX was sent in error and to disregard. Rock is aware that patient does not need to take Omeprazole . Rock will take medication to pharmacy for disposal.

## 2023-11-20 NOTE — Telephone Encounter (Signed)
 Lm on vm for patient's sister Rock Gully to return call.
